# Patient Record
Sex: Female | Born: 1965 | Race: Black or African American | Hispanic: No | Smoking: Current every day smoker
Health system: Southern US, Community
[De-identification: ages and names within clinical notes are randomized; demographics above are authoritative.]

## PROBLEM LIST (undated history)

## (undated) DIAGNOSIS — T7840XA Allergy, unspecified, initial encounter: Secondary | ICD-10-CM

## (undated) DIAGNOSIS — M1990 Unspecified osteoarthritis, unspecified site: Secondary | ICD-10-CM

## (undated) DIAGNOSIS — F1090 Alcohol use, unspecified, uncomplicated: Secondary | ICD-10-CM

## (undated) DIAGNOSIS — Z7289 Other problems related to lifestyle: Secondary | ICD-10-CM

## (undated) DIAGNOSIS — Z5189 Encounter for other specified aftercare: Secondary | ICD-10-CM

## (undated) DIAGNOSIS — I10 Essential (primary) hypertension: Secondary | ICD-10-CM

## (undated) DIAGNOSIS — Z789 Other specified health status: Secondary | ICD-10-CM

## (undated) DIAGNOSIS — I447 Left bundle-branch block, unspecified: Secondary | ICD-10-CM

## (undated) HISTORY — DX: Left bundle-branch block, unspecified: I44.7

## (undated) HISTORY — DX: Other problems related to lifestyle: Z72.89

## (undated) HISTORY — PX: KNEE SURGERY: SHX244

## (undated) HISTORY — DX: Alcohol use, unspecified, uncomplicated: F10.90

## (undated) HISTORY — DX: Encounter for other specified aftercare: Z51.89

## (undated) HISTORY — DX: Other specified health status: Z78.9

## (undated) HISTORY — DX: Allergy, unspecified, initial encounter: T78.40XA

## (undated) HISTORY — PX: KNEE CARTILAGE SURGERY: SHX688

## (undated) HISTORY — DX: Unspecified osteoarthritis, unspecified site: M19.90

---

## 2000-03-26 ENCOUNTER — Encounter: Payer: Self-pay | Admitting: Emergency Medicine

## 2000-03-26 ENCOUNTER — Emergency Department (HOSPITAL_COMMUNITY): Admission: EM | Admit: 2000-03-26 | Discharge: 2000-03-26 | Payer: Self-pay | Admitting: Emergency Medicine

## 2001-11-16 ENCOUNTER — Emergency Department (HOSPITAL_COMMUNITY): Admission: EM | Admit: 2001-11-16 | Discharge: 2001-11-17 | Payer: Self-pay | Admitting: Emergency Medicine

## 2008-11-21 ENCOUNTER — Observation Stay (HOSPITAL_COMMUNITY): Admission: EM | Admit: 2008-11-21 | Discharge: 2008-11-23 | Payer: Self-pay | Admitting: Emergency Medicine

## 2010-11-26 LAB — BASIC METABOLIC PANEL
BUN: 4 mg/dL — ABNORMAL LOW (ref 6–23)
CO2: 25 mEq/L (ref 19–32)
Calcium: 8.8 mg/dL (ref 8.4–10.5)
Chloride: 105 mEq/L (ref 96–112)
Creatinine, Ser: 0.44 mg/dL (ref 0.4–1.2)
GFR calc Af Amer: >60 mL/min (ref >60)
GFR calc non Af Amer: >60 mL/min (ref >60)
Glucose, Bld: 85 mg/dL (ref 70–99)
Potassium: 3.7 mEq/L (ref 3.5–5.1)
Sodium: 138 mEq/L (ref 135–145)

## 2010-11-26 LAB — DIFFERENTIAL
Basophils Relative: 0 % (ref 0–1)
Eosinophils Absolute: 0.1 10*3/uL (ref 0.0–0.7)
Eosinophils Relative: 1 % (ref 0–5)
Lymphs Abs: 3 10*3/uL (ref 0.7–4.0)
Monocytes Absolute: 0.5 10*3/uL (ref 0.1–1.0)
Neutro Abs: 4.2 10*3/uL (ref 1.7–7.7)

## 2010-11-26 LAB — FOLATE: Folate: 7.4 ng/mL

## 2010-11-26 LAB — COMPREHENSIVE METABOLIC PANEL
Alkaline Phosphatase: 78 U/L (ref 39–117)
BUN: 4 mg/dL — ABNORMAL LOW (ref 6–23)
Chloride: 104 mEq/L (ref 96–112)
GFR calc non Af Amer: >60 mL/min (ref >60)
Glucose, Bld: 84 mg/dL (ref 70–99)
Potassium: 3.2 mEq/L — ABNORMAL LOW (ref 3.5–5.1)
Total Bilirubin: 0.8 mg/dL (ref 0.3–1.2)

## 2010-11-26 LAB — SAMPLE TO BLOOD BANK

## 2010-11-26 LAB — VITAMIN B12: Vitamin B-12: 436 pg/mL (ref 211–911)

## 2010-11-26 LAB — CROSSMATCH

## 2010-11-26 LAB — LIPID PANEL
LDL Cholesterol: 101 mg/dL — ABNORMAL HIGH (ref 0–99)
Total CHOL/HDL Ratio: 2.3 RATIO
VLDL: 8 mg/dL (ref 0–40)

## 2010-11-26 LAB — CBC
HCT: 25.9 % — ABNORMAL LOW (ref 36.0–46.0)
HCT: 26.7 % — ABNORMAL LOW (ref 36.0–46.0)
HCT: 28.9 % — ABNORMAL LOW (ref 36.0–46.0)
Hemoglobin: 8.4 g/dL — ABNORMAL LOW (ref 12.0–15.0)
Hemoglobin: 8.8 g/dL — ABNORMAL LOW (ref 12.0–15.0)
MCHC: 32 g/dL (ref 30.0–36.0)
MCHC: 32.5 g/dL (ref 30.0–36.0)
MCV: 69.4 fL — ABNORMAL LOW (ref 78.0–100.0)
MCV: 73.3 fL — ABNORMAL LOW (ref 78.0–100.0)
MCV: 73.7 fL — ABNORMAL LOW (ref 78.0–100.0)
Platelets: 286 10*3/uL (ref 150–400)
Platelets: 316 10*3/uL (ref 150–400)
RBC: 3.73 MIL/uL — ABNORMAL LOW (ref 3.87–5.11)
RDW: 19.8 % — ABNORMAL HIGH (ref 11.5–15.5)
RDW: 22.4 % — ABNORMAL HIGH (ref 11.5–15.5)
WBC: 6.3 10*3/uL (ref 4.0–10.5)
WBC: 7.8 10*3/uL (ref 4.0–10.5)

## 2010-11-26 LAB — IRON AND TIBC
Iron: 12 ug/dL — ABNORMAL LOW (ref 42–135)
Saturation Ratios: 3 % — ABNORMAL LOW (ref 20–55)
TIBC: 452 ug/dL (ref 250–470)

## 2010-11-26 LAB — HEMOCCULT GUIAC POC 1CARD (OFFICE): Fecal Occult Bld: POSITIVE

## 2010-11-26 LAB — RETICULOCYTES
RBC.: 3.94 MIL/uL (ref 3.87–5.11)
Retic Ct Pct: 0.8 % (ref 0.4–3.1)

## 2010-11-26 LAB — ABO/RH: ABO/RH(D): O POS

## 2010-11-26 LAB — HEMOGLOBIN A1C
Hgb A1c MFr Bld: 5.5 % (ref 4.6–6.1)
Mean Plasma Glucose: 111 mg/dL

## 2010-11-26 LAB — APTT: aPTT: 27 seconds (ref 24–37)

## 2010-12-30 NOTE — Discharge Summary (Signed)
Kerry [name], [place] NO.:  [id number]   MEDICAL RECORD NO.:  [id number]          PATIENT TYPE:  INP   LOCATION:  3706                         FACILITY:  [facility]   PHYSICIAN:  Altha Harm, MDDATE OF BIRTH:  Nov 28, 1965   DATE OF ADMISSION:  11/21/2008  DATE OF DISCHARGE:  11/22/2008                               DISCHARGE SUMMARY   DISCHARGE DISPOSITION:  Home.   FINAL DISCHARGE DIAGNOSES:  1. Bright red blood per rectum.  2. Acute blood loss versus chronic anemia.  3. Hypokalemia.  4. Hypomagnesemia.  5. Tobacco use disorder.  6. Binge drinker.  7. Uterine fibroids.   DISCHARGE MEDICATIONS:  1. Proctosol ER 1 tablet per rectum b.i.d.  2. Docusate sodium 100 mg p.o. b.i.d.  3. Magnesium oxide 400 mg p.o. b.i.d.  4. Iron sulfate 325 mg p.o. b.i.d.   CONSULTATIONS:  Phone consultation with Dr. Randa Evens, [facility].   PROCEDURE:  None.   DIAGNOSTIC STUDIES:  CT abdomen and pelvis with contrast which shows a  negative CT of the abdomen and no acute findings in the pelvis.  There  is an approximately 1 cm anterior uterine fibroid.   ALLERGIES:  No known drug allergies.   CODE STATUS:  Full code.   PRIMARY CARE PHYSICIAN:  Unassigned.   CHIEF COMPLAINT:  Bright red blood per rectum.   HISTORY OF PRESENT ILLNESS:  Please refer to history and physical  dictated by Dr. Gwenlyn Perking for details of the HPI.   HOSPITAL COURSE:  1. The patient was admitted on an observation basis to be evaluated      for bright red blood per rectum.  The patient had no further      bleeding during her observation period in the hospital.  The      patient did have visible external hemorrhoids and likely has some      internal hemorrhoids.  The patient's hemoglobin remained stable      during the time that she was here.  The patient has had no recent      labs to compare hemoglobins.  She did offer that she had labs at      the health department; however, I spoke with the  health department      who indicated that there was not a CBC done at that time.  The      patient is discharged home on rectal steroid, Proctosol; also on a      stool softener; and she is to follow up with Dr. Randa Evens, [facility]      - number has been provided.  The patient will also be given a      prescription for iron sulfate which she is to take on a daily      basis.  2. Hypomagnesemia, likely secondary to the patient's binge drinking,      and the patient will be given a prescription for mag oxide for      supplementation.  3. Uterine fibroid.  The patient is to follow up with her OB/GYN for      further evaluation  and treatment.  4. Tobacco use disorder.  The patient is counseled on tobacco      cessation.  5. Alcohol binge drinking.  The patient has been counseled against      further binge drinking.   The patient is to follow a high-fiber diet.  She has been given  instructions of when she would initiate use of intrarectal hemorrhoids.  She should follow up with a gastroenterologist.  In terms of her  physical restrictions - none.  Follow-up with Dr. Randa Evens in [facility]  for further evaluation as an outpatient.   Total time of discharge process - 39 minutes.      Altha Harm, MD  Electronically Signed     MAM/MEDQ  D:  11/22/2008  T:  11/22/2008  Job:  [id number]   cc:   Fayrene Fearing L. Malon Kindle., M.D.

## 2010-12-30 NOTE — H&P (Signed)
NAME:  KHADIJATOU, BORAK NO.:  [id number]   MEDICAL RECORD NO.:  [id number]          PATIENT TYPE:  EMS   LOCATION:  MAJO                         FACILITY:  [facility]   PHYSICIAN:  Eduard Clos, MDDATE OF BIRTH:  November 16, 1965   DATE OF ADMISSION:  11/21/2008  DATE OF DISCHARGE:                              HISTORY & PHYSICAL   This is a dictation on behalf of Dr. Koren Shiver.   The patient will be followed per [facility] Team G.   Kerry [name] is a 45 year old female without past medical history of  importance, except for tobacco abuse for the last 20-25 years, who came  to the emergency department complaining of rectal bleeding.  The patient  was in her normal state of health when she experienced the morning of  admission a necessity to have a bowel movement and noticed a lot of  blood together with her stool.  The patient reports a second episode of  just blood in the afternoon prior to coming to the emergency department.  She denies abdominal pain, nausea, vomiting, fever or any other  discomfort at this time.  She had never received a colonoscopy.  This is  the first time that she experienced these symptoms.  She does not have  any family history of colon cancer or any other GI problems.  Of note,  the patient is still menstruating.  Her menstrual period is normally 4-5  days, regular with two-to-three heavy days and her last menstrual period  was on November 18, 2008.   PAST MEDICAL HISTORY:  Just tobacco abuse.   ALLERGIES:  NO KNOWN DRUG ALLERGIES.   FAMILY HISTORY:  Strong family history for diabetes, hypertension and  hyperlipidemia affecting one brother, sister, mother and an uncle.   MEDICATIONS:  The patient is not taking any medication at this point.   SOCIAL HISTORY:  Positive for half to one pack of cigarettes per day for  the last 20-25 years.  Occasional drinking, normally over the weekend,  four beers.  Denies illicit drugs.   REVIEW  OF SYSTEMS:  All systems negative except as noted in the HPI.   PHYSICAL EXAMINATION:  GENERAL:  Patient in no acute distress, laying on  bed, pale.  Lungs:  Clear to auscultation bilaterally.  No crackles, no wheezing.  HEART:  Normal rate and rhythm.  Normal S1-S2.  There were no rubs or  gallops auscultated.  ABDOMEN:  Soft, not tender or distended.  Positive  bowel sounds.  No masses.  EXTREMITIES:  No edema, cyanosis or clubbing.  Good pulses bilaterally.  NEUROLOGIC:  The patient was alert, awake and oriented x3 with cranial  nerves II-XII intact.  Muscle strength 5/5.  Negative Babinski.  Normal  finger-to-nose.  RECTAL:  Normal anal tone.  No stool in rectum.  No frank bleeding.  During rectal exam, there was a positive internal hemorrhoid palpated.  SKIN:  A little bit of paleness, but no rash or lesions visible during  exam.  VITAL SIGNS:  Blood pressure 143/77, heart rate 80, respiratory rate 18.  Oxygen saturation was 100% on  room air.   LABORATORY DATA ON ADMISSION:  White blood cells 7.8, hemoglobin 8.4,  platelets 316.  MCV was 69.4 with an RDW of 19.8.  PT 13.7, INR 1.0, PTT  27.  Sodium 138, potassium 3.7, chloride 105, bicarb 25, BUN 4,  creatinine 0.44, blood sugar 85.  The patient had an anemia panel with  partial results at this time with an iron level of 12, TIBC of 152 and  an oxygen saturation of 3.  Fecal occult blood test was positive.  The  patient also had a lipid profile with an LDL of 101, HDL 83, total  cholesterol 192 and triglycerides 41.   ASSESSMENT AND PLAN:  1. Rectal bleeding.  Differential diagnoses includes diverticulosis,      internal hemorrhoids, malignancy and arteriovenous malformations.      We are going to order a CT of the abdomen and pelvis in order to      rule out diverticulosis.  We are going to give IV fluid      resuscitation.  We are going to type and screen 2 units and hold      it.  We are going to type, cross and transfuse 1  unit.  We are      going to monitor hemoglobin and hematocrit and we are going to      check an anemia panel on her.  Patient without any family history      of colon cancer or any other gastrointestinal problems.  We are      going to consult GI in the morning for a colonoscopy if needed.  2. Anemia.  Most slightly, her anemia will be secondary to her      menstrual period which is still active at this point exacerbated      due to her gastrointestinal bleed.  At this point, hemoglobin is 8.      We are going to check hemoglobin and hematocrit every 8 hours for      24 hours.  We are going to check an anemia panel.  We are going to      rule out gastrointestinal source of bleeding.  We are going to      transfuse 1 unit.  If the anemia panel is positive for iron      deficiency anemia, which is most likely what I assume, we are going      to start the patient on iron repletion by mouth.  3. Tobacco abuse.  We are going to ask social worker for cessation      counseling and will offer nicotine patch in case that she needs it.  4. Screening for her age since the patient lacks a primary care      physician.  We are going to check a TSH , lipid profile and also      hemoglobin A1c, especially because the patient has a strong family      history for diabetes, hypertension and hyperlipidemia.  5. For prophylaxis, the patient will receive Protonix and sequential      compression devices.      Rosanna Randy, MD  Electronically Signed      Eduard Clos, MD  Electronically Signed    CEM/MEDQ  D:  11/21/2008  T:  11/21/2008  Job:  [id number]

## 2013-04-10 ENCOUNTER — Emergency Department (HOSPITAL_COMMUNITY): Payer: Self-pay

## 2013-04-10 ENCOUNTER — Emergency Department (HOSPITAL_COMMUNITY)
Admission: EM | Admit: 2013-04-10 | Discharge: 2013-04-10 | Disposition: A | Discharge status: Home / Self Care | Payer: Self-pay | Attending: Emergency Medicine | Admitting: Emergency Medicine

## 2013-04-10 DIAGNOSIS — W010XXA Fall on same level from slipping, tripping and stumbling without subsequent striking against object, initial encounter: Secondary | ICD-10-CM | POA: Insufficient documentation

## 2013-04-10 DIAGNOSIS — Y929 Unspecified place or not applicable: Secondary | ICD-10-CM | POA: Insufficient documentation

## 2013-04-10 DIAGNOSIS — Y9301 Activity, walking, marching and hiking: Secondary | ICD-10-CM | POA: Insufficient documentation

## 2013-04-10 DIAGNOSIS — M25519 Pain in unspecified shoulder: Secondary | ICD-10-CM | POA: Insufficient documentation

## 2013-04-10 DIAGNOSIS — M25512 Pain in left shoulder: Secondary | ICD-10-CM

## 2013-04-10 DIAGNOSIS — W19XXXA Unspecified fall, initial encounter: Secondary | ICD-10-CM

## 2013-04-10 MED ORDER — NAPROXEN 500 MG PO TABS: 500.0000 mg | ORAL_TABLET | Freq: Two times a day (BID) | ORAL | Status: DC

## 2013-04-10 NOTE — ED Notes (Signed)
Fell on sat and hurt  Left shoulder has good radial pulse and can wiggle fingers less than 3 sec cap refill

## 2013-04-10 NOTE — ED Provider Notes (Signed)
  CSN: [id number]     Arrival date & time 04/10/13  4098 History     First MD Initiated Contact with Patient 04/10/13 1009     Chief Complaint  Patient presents with  . Fall  . Arm Pain   (Consider location/radiation/quality/duration/timing/severity/associated sxs/prior Treatment) HPI Comments: Patient presents with a chief complaint of left shoulder pain.  Pain has been present since she fell two days ago.  She states that she tripped on a hole while wearing flip flops and landed on her left shoulder.  She has had constant pain of the left shoulder since that time.  Pain worse with movement and palpation.  She denies hitting her head.  Denies neck or back pain.  Denies any other pain.  Denies numbness or tingling.  She has not taken anything for pain prior to arrival.  No prior injury to the shoulder.  The history is provided by the patient.    No past medical history on file. No past surgical history on file. No family history on file. History  Substance Use Topics  . Smoking status: Not on file  . Smokeless tobacco: Not on file  . Alcohol Use: Not on file   OB History   No data available     Review of Systems  Musculoskeletal:       Left shoulder pain  All other systems reviewed and are negative.    Allergies  Review of patient's allergies indicates no known allergies.  Home Medications  No current outpatient prescriptions on file. BP 145/84  Pulse 112  Temp(Src) 98.3 F (36.8 C)  Resp 16  SpO2 99% Physical Exam  Nursing note and vitals reviewed. Constitutional: She appears well-developed and well-nourished.  HENT:  Head: Normocephalic and atraumatic.  Neck: Normal range of motion. Neck supple.  Cardiovascular: Normal rate, regular rhythm and normal heart sounds.   Pulses:      Radial pulses are 2+ on the right side, and 2+ on the left side.  Pulmonary/Chest: Effort normal and breath sounds normal.  Musculoskeletal:       Left shoulder: She exhibits bony  tenderness. She exhibits no swelling, no effusion, no deformity and normal pulse.       Left elbow: She exhibits normal range of motion, no swelling and no deformity. No tenderness found.  Full ROM of the left shoulder, but increased pain with ROM  Neurological: She is alert.  Normal sensation of left fingers  Skin: Skin is warm and dry.     No bruising or abrasions of the left shoulder.  Psychiatric: She has a normal mood and affect.    ED Course   Procedures (including critical care time)  Labs Reviewed - No data to display Dg Shoulder Left  04/10/2013   *RADIOLOGY REPORT*  Clinical Data: Left shoulder pain following injury  LEFT SHOULDER - 2+ VIEW  Comparison: None.  Findings: No acute fracture or dislocation is noted.  No soft tissue changes are seen.  IMPRESSION: No acute abnormality noted.   Original Report Authenticated By: Alcide Clever, M.D.   No diagnosis found.  MDM  Patient presenting with left shoulder pain after a mechanical fall that occurred two days ago.  Xray negative.  Patient neurovascularly intact.  Patient offered sling, but declined.  Patient discharged home with Rx for Naproxen.  Return precautions given.  Pascal Lux [place], PA-C 04/10/13 1125

## 2013-04-10 NOTE — ED Notes (Signed)
Patient transported to X-ray 

## 2013-04-12 NOTE — ED Provider Notes (Signed)
Medical screening examination/treatment/procedure(s) were performed by non-physician practitioner and as supervising physician I was immediately available for consultation/collaboration.   Kacie Huxtable M Dink Creps, DO 04/12/13 2105 

## 2013-08-12 ENCOUNTER — Emergency Department (HOSPITAL_COMMUNITY)
Admission: EM | Admit: 2013-08-12 | Discharge: 2013-08-12 | Disposition: A | Discharge status: Home / Self Care | Payer: Self-pay | Attending: Emergency Medicine | Admitting: Emergency Medicine

## 2013-08-12 ENCOUNTER — Encounter (HOSPITAL_COMMUNITY): Payer: Self-pay | Admitting: Emergency Medicine

## 2013-08-12 DIAGNOSIS — R21 Rash and other nonspecific skin eruption: Secondary | ICD-10-CM | POA: Insufficient documentation

## 2013-08-12 DIAGNOSIS — F17200 Nicotine dependence, unspecified, uncomplicated: Secondary | ICD-10-CM | POA: Insufficient documentation

## 2013-08-12 DIAGNOSIS — Z791 Long term (current) use of non-steroidal anti-inflammatories (NSAID): Secondary | ICD-10-CM | POA: Insufficient documentation

## 2013-08-12 MED ORDER — PREDNISONE 20 MG PO TABS: ORAL_TABLET | ORAL | Status: DC

## 2013-08-12 NOTE — ED Provider Notes (Signed)
CSN: [id number]     Arrival date & time 08/12/13  1222 History   None   This chart was scribed for Trixie Dredge PA-C, a non-physician practitioner working with Ethelda Chick, MD by Lewanda Rife, ED Scribe. This patient was seen in room TR05C/TR05C and the patient's care was started at 1:12 PM     Chief Complaint  Patient presents with  . Rash   (Consider location/radiation/quality/duration/timing/severity/associated sxs/prior Treatment) The history is provided by the patient and medical records. No language interpreter was used.   HPI Comments: Kerry [name] is a 47 y.o. female who presents to the Emergency Department complaining of constant unchanged pruritic rash on torso onset 2 weeks. Describes pain as pins and needles. Denies associated new detergents, contacts with similar rash, new furniture, new lotions.  Denies seeing any insects.  Denies abdominal pain, recent illness.   History reviewed. No pertinent past medical history. History reviewed. No pertinent past surgical history. No family history on file. History  Substance Use Topics  . Smoking status: Current Every Day Smoker  . Smokeless tobacco: Not on file  . Alcohol Use: Yes   OB History   Grav Para Term Preterm Abortions TAB SAB Ect Mult Living                 Review of Systems  Constitutional: Negative for fever and chills.  Skin: Positive for rash. Negative for color change and wound.  Allergic/Immunologic: Negative for immunocompromised state.    Allergies  Review of patient's allergies indicates no known allergies.  Home Medications   Current Outpatient Rx  Name  Route  Sig  Dispense  Refill  . naproxen (NAPROSYN) 500 MG tablet   Oral   Take 1 tablet (500 mg total) by mouth 2 (two) times daily.   30 tablet   0    BP 154/109  Pulse 100  Temp(Src) 98 F (36.7 C) (Oral)  Resp 16  Wt 125 lb 3.2 oz (56.79 kg)  SpO2 100%  LMP 07/17/2013 Physical Exam  Nursing note and vitals  reviewed. Constitutional: She appears well-developed and well-nourished. No distress.  HENT:  Head: Normocephalic and atraumatic.  Neck: Neck supple.  Pulmonary/Chest: Effort normal.  Abdominal: Soft. There is no tenderness.  Neurological: She is alert.  Skin: She is not diaphoretic.  Non-tender clusters of papular rash on abdomen and low back only.  Not on extremities, face, chest, or upper back.  No burrows noted.  No overlying erythema, edema, warmth, or tenderness.  No discharge.     ED Course  Procedures   COORDINATION OF CARE:  Nursing notes reviewed. Vital signs reviewed. Initial pt interview and examination performed.      Treatment plan initiated:Medications - No data to display   Initial diagnostic testing ordered.    Labs Review Labs Reviewed - No data to display Imaging Review No results found.  EKG Interpretation   None       MDM   1. Rash    Pt with pruritic rash around waistline that does not appear to be scabies, likely contact dermatitis from unknown source.  D/C home with prednisone.  Derm follow up if lack of improvement.  Discussed  findings, treatment, and follow up  with patient.  Pt given return precautions.  Pt verbalizes understanding and agrees with plan.      I personally performed the services described in this documentation, which was scribed in my presence. The recorded information has been reviewed and is  accurate.    Trixie Dredge, PA-C 08/12/13 1541

## 2013-08-12 NOTE — ED Provider Notes (Signed)
Medical screening examination/treatment/procedure(s) were performed by non-physician practitioner and as supervising physician I was immediately available for consultation/collaboration.  EKG Interpretation   None        Tamaiya Bump K Linker, MD 08/12/13 1553 

## 2013-08-12 NOTE — ED Notes (Signed)
Pt. Stated, I ve had a rash for 2 weeks.

## 2015-04-14 ENCOUNTER — Encounter (HOSPITAL_COMMUNITY): Payer: Self-pay | Admitting: Emergency Medicine

## 2015-04-14 ENCOUNTER — Emergency Department (HOSPITAL_COMMUNITY): Payer: Self-pay

## 2015-04-14 ENCOUNTER — Emergency Department (HOSPITAL_COMMUNITY)
Admission: EM | Admit: 2015-04-14 | Discharge: 2015-04-15 | Disposition: A | Discharge status: Home / Self Care | Payer: Self-pay | Attending: Emergency Medicine | Admitting: Emergency Medicine

## 2015-04-14 DIAGNOSIS — Z880 Allergy status to penicillin: Secondary | ICD-10-CM | POA: Insufficient documentation

## 2015-04-14 DIAGNOSIS — F1010 Alcohol abuse, uncomplicated: Secondary | ICD-10-CM | POA: Insufficient documentation

## 2015-04-14 DIAGNOSIS — J069 Acute upper respiratory infection, unspecified: Secondary | ICD-10-CM | POA: Insufficient documentation

## 2015-04-14 DIAGNOSIS — Z7951 Long term (current) use of inhaled steroids: Secondary | ICD-10-CM | POA: Insufficient documentation

## 2015-04-14 DIAGNOSIS — Z720 Tobacco use: Secondary | ICD-10-CM | POA: Insufficient documentation

## 2015-04-14 LAB — CBC
HEMATOCRIT: 27.2 % — AB (ref 36.0–46.0)
HEMOGLOBIN: 8 g/dL — AB (ref 12.0–15.0)
MCH: 17.7 pg — ABNORMAL LOW (ref 26.0–34.0)
MCHC: 29.4 g/dL — ABNORMAL LOW (ref 30.0–36.0)
MCV: 60 fL — ABNORMAL LOW (ref 78.0–100.0)
Platelets: 513 10*3/uL — ABNORMAL HIGH (ref 150–400)
RBC: 4.53 MIL/uL (ref 3.87–5.11)
RDW: 19.9 % — ABNORMAL HIGH (ref 11.5–15.5)
WBC: 9.5 10*3/uL (ref 4.0–10.5)

## 2015-04-14 LAB — BASIC METABOLIC PANEL
ANION GAP: 13 (ref 5–15)
BUN: <5 mg/dL — ABNORMAL LOW (ref 6–20)
CO2: 22 mmol/L (ref 22–32)
Calcium: 9.1 mg/dL (ref 8.9–10.3)
Chloride: 104 mmol/L (ref 101–111)
Creatinine, Ser: 0.53 mg/dL (ref 0.44–1.00)
GFR calc Af Amer: >60 mL/min (ref >60)
Glucose, Bld: 87 mg/dL (ref 65–99)
POTASSIUM: 3.4 mmol/L — AB (ref 3.5–5.1)
SODIUM: 139 mmol/L (ref 135–145)

## 2015-04-14 LAB — ETHANOL: Alcohol, Ethyl (B): 275 mg/dL — ABNORMAL HIGH (ref <5)

## 2015-04-14 LAB — I-STAT TROPONIN, ED: TROPONIN I, POC: 0 ng/mL (ref 0.00–0.08)

## 2015-04-14 NOTE — ED Notes (Signed)
Patient reports pain with productive, "mucousy" cough. Denies chest pain. Reports symptoms began last Tuesday. No acute distress noted.

## 2015-04-14 NOTE — ED Notes (Signed)
Pt. reports right chest pain with SOB , productive cough and chest congestion onset last week , denies nausea or diaphoresis , pain increases with deep inspiration and when coughing . Denies fever or chills .Drank ETOH this evening .

## 2015-04-14 NOTE — ED Provider Notes (Signed)
CSN: [id number]     Arrival date & time 04/14/15  2100 History  This chart was scribed for Shon Baton, MD by Evon Slack, ED Scribe. This patient was seen in room A04C/A04C and the patient's care was started at 11:05 PM.     No chief complaint on file.  The history is provided by the patient. No language interpreter was used.   HPI Comments: Kerry [name] is a 49 y.o. female who presents to the Emergency Department complaining of right sided sore throat onset 5 days prior. Pt states that she feels as if she swallowed a pill that is stuck in her throat. Pt states that the pain in her throat is worse with swallowing. Pt states that the pain radiates into her right chest. She states that burping or coughing makes the pain worse. Pt reports associated congestion, productive cough and voice change. 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 Denies fever or other related symptoms. Pt denies Hx of CAD, HTN or DM.   History reviewed. No pertinent past medical history. Past Surgical History  Procedure Laterality Date  . Knee surgery     No family history on file. Social History  Substance Use Topics  . Smoking status: Current Every Day Smoker  . Smokeless tobacco: None  . Alcohol Use: Yes   OB History    No data available     Review of Systems  Constitutional: Negative for fever.  HENT: Positive for sore throat and voice change.   Respiratory: Positive for cough.   Cardiovascular: Positive for chest pain. Negative for leg swelling.  Gastrointestinal: Negative for nausea, vomiting, abdominal pain and diarrhea.  Skin: Negative for rash.  All other systems reviewed and are negative.     Allergies  Diphenhydramine and Amoxicillin  Home Medications   Prior to Admission medications   Medication Sig Start Date End Date Taking? Authorizing Provider  fluticasone (FLONASE) 50 MCG/ACT nasal spray Place 2 sprays into both nostrils daily. 04/15/15   Shon Baton, MD   naproxen (NAPROSYN) 500 MG tablet Take 1 tablet (500 mg total) by mouth 2 (two) times daily with a meal. 04/15/15   Shon Baton, MD  predniSONE (DELTASONE) 20 MG tablet 3 tabs po daily x 3 days, then 2 tabs x 3 days, then 1.5 tabs x 3 days, then 1 tab x 3 days, then 0.5 tabs x 3 days Patient not taking: Reported on 04/14/2015 08/12/13   Trixie Dredge, PA-C  sodium chloride (OCEAN) 0.65 % SOLN nasal spray Place 1 spray into both nostrils as needed for congestion. 04/15/15   Shon Baton, MD   BP 115/72 mmHg  Pulse 76  Temp(Src) 98.2 F (36.8 C) (Oral)  Resp 14  Ht 4\' 11"  (1.499 m)  Wt 135 lb (61.236 kg)  BMI 27.25 kg/m2  SpO2 98%  LMP 07/17/2013   Physical Exam  Constitutional: She is oriented to person, place, and time.  Intoxicated  HENT:  Head: Normocephalic and atraumatic.  Right Ear: External ear normal.  Left Ear: External ear normal.  Mouth/Throat: Oropharynx is clear and moist.  No tonsillar enlargement, no evidence of tonsillar exudate, uvula midline  Eyes: Pupils are equal, round, and reactive to light.  Neck: Neck supple.  Cardiovascular: Normal rate, regular rhythm and normal heart sounds.   No murmur heard. Pulmonary/Chest: Effort normal and breath sounds normal. No respiratory distress. She has no wheezes. She exhibits no tenderness.  Abdominal: Soft. Bowel sounds  are normal. There is no tenderness. There is no rebound.  Musculoskeletal: She exhibits no edema.  Lymphadenopathy:    She has cervical adenopathy.  Neurological: She is alert and oriented to person, place, and time.  Skin: Skin is warm and dry.  Psychiatric: She has a normal mood and affect.  Nursing note and vitals reviewed.   ED Course  Procedures (including critical care time) DIAGNOSTIC STUDIES: Oxygen Saturation is 99% on RA, normal by my interpretation.    COORDINATION OF CARE: 11:29 PM-Discussed treatment plan with pt at bedside and pt agreed to plan.     Labs Review Labs  Reviewed  BASIC METABOLIC PANEL - Abnormal; Notable for the following:    Potassium 3.4 (*)    BUN <5 (*)    All other components within normal limits  CBC - Abnormal; Notable for the following:    Hemoglobin 8.0 (*)    HCT 27.2 (*)    MCV 60.0 (*)    MCH 17.7 (*)    MCHC 29.4 (*)    RDW 19.9 (*)    Platelets 513 (*)    All other components within normal limits  ETHANOL - Abnormal; Notable for the following:    Alcohol, Ethyl (B) 275 (*)    All other components within normal limits  RAPID STREP SCREEN (NOT AT [facility])  CULTURE, GROUP A STREP  I-STAT TROPOININ, ED    Imaging Review Dg Chest 2 View  04/14/2015   CLINICAL DATA:  Nonproductive cough with choking sensation when swallowing and right-sided chest swelling.  EXAM: CHEST  2 VIEW  COMPARISON:  None.  FINDINGS: The heart size and mediastinal contours are within normal limits. Both lungs are clear. The visualized skeletal structures are unremarkable.  IMPRESSION: No active cardiopulmonary disease.   Electronically Signed   By: Elberta Fortis M.D.   On: 04/14/2015 22:05   I have personally reviewed and evaluated these images and lab results as part of my medical decision-making.   EKG Interpretation   Date/Time:  Sunday April 14 2015 21:07:40 EDT Ventricular Rate:  95 PR Interval:  154 QRS Duration: 78 QT Interval:  380 QTC Calculation: 477 R Axis:   86 Text Interpretation:  Normal sinus rhythm Normal ECG No prior for  comparison Confirmed by HORTON  MD, Toni Amend ([id number]) on 04/14/2015 11:04:02  PM      MDM   Final diagnoses:  Upper respiratory infection  Alcohol abuse    Patient presents with sore throat, cough, congestion, and chest pain. Nontoxic on exam. Vital signs are reassuring. Patient has been afebrile. Also endorses alcohol use.  EKG and chest x-ray reassuring. No evidence of pneumonia. Doubt ACS given clinical history of upper respiratory symptoms.  Strep screen is negative. Discussed with patient  supportive care home including nasal saline, nasal steroid-induced, and NSAIDs area patient stated understanding.  After history, exam, and medical workup I feel the patient has been appropriately medically screened and is safe for discharge home. Pertinent diagnoses were discussed with the patient. Patient was given return precautions.  I personally performed the services described in this documentation, which was scribed in my presence. The recorded information has been reviewed and is accurate.      Shon Baton, MD 04/15/15 [id number]

## 2015-04-15 LAB — RAPID STREP SCREEN (MED CTR MEBANE ONLY): STREPTOCOCCUS, GROUP A SCREEN (DIRECT): NEGATIVE

## 2015-04-15 MED ORDER — NAPROXEN 500 MG PO TABS: 500.0000 mg | ORAL_TABLET | Freq: Two times a day (BID) | ORAL | Status: DC

## 2015-04-15 MED ORDER — SALINE SPRAY 0.65 % NA SOLN: 1.0000 | NASAL | Status: DC | PRN

## 2015-04-15 MED ORDER — FLUTICASONE PROPIONATE 50 MCG/ACT NA SUSP
2.0000 | Freq: Every day | NASAL | Status: DC
Start: 2015-04-15 — End: 2017-09-22

## 2015-04-15 NOTE — Discharge Instructions (Signed)
You were seen today for cough, congestion, sore throat, and chest pain. Your symptoms are most suggestive of a viral upper respiratory infection. Your heart testing and lab work is reassuring. Antibiotics do not help with viral illnesses. Supportive care with nasal saline, nasal steroidal, and anti-inflammatory use. See return precautions below.  Cough, Adult  A cough is a reflex that helps clear your throat and airways. It can help heal the body or may be a reaction to an irritated airway. A cough may only last 2 or 3 weeks (acute) or may last more than 8 weeks (chronic).  CAUSES Acute cough:  Viral or bacterial infections. Chronic cough:  Infections.  Allergies.  Asthma.  Post-nasal drip.  Smoking.  Heartburn or acid reflux.  Some medicines.  Chronic lung problems (COPD).  Cancer. SYMPTOMS   Cough.  Fever.  Chest pain.  Increased breathing rate.  High-pitched whistling sound when breathing (wheezing).  Colored mucus that you cough up (sputum). TREATMENT   A bacterial cough may be treated with antibiotic medicine.  A viral cough must run its course and will not respond to antibiotics.  Your caregiver may recommend other treatments if you have a chronic cough. HOME CARE INSTRUCTIONS   Only take over-the-counter or prescription medicines for pain, discomfort, or fever as directed by your caregiver. Use cough suppressants only as directed by your caregiver.  Use a cold steam vaporizer or humidifier in your bedroom or home to help loosen secretions.  Sleep in a semi-upright position if your cough is worse at night.  Rest as needed.  Stop smoking if you smoke. SEEK IMMEDIATE MEDICAL CARE IF:   You have pus in your sputum.  Your cough starts to worsen.  You cannot control your cough with suppressants and are losing sleep.  You begin coughing up blood.  You have difficulty breathing.  You develop pain which is getting worse or is uncontrolled with  medicine.  You have a fever. MAKE SURE YOU:   Understand these instructions.  Will watch your condition.  Will get help right away if you are not doing well or get worse. Document Released: 01/30/2011 Document Revised: 10/26/2011 Document Reviewed: 01/30/2011 [facility] Patient Information 2015 [place], [place]. This information is not intended to replace advice given to you by your health care provider. Make sure you discuss any questions you have with your health care provider. Upper Respiratory Infection, Adult An upper respiratory infection (URI) is also sometimes known as the common cold. The upper respiratory tract includes the nose, sinuses, throat, trachea, and bronchi. Bronchi are the airways leading to the lungs. Most people improve within 1 week, but symptoms can last up to 2 weeks. A residual cough may last even longer.  CAUSES Many different viruses can infect the tissues lining the upper respiratory tract. The tissues become irritated and inflamed and often become very moist. Mucus production is also common. A cold is contagious. You can easily spread the virus to others by oral contact. This includes kissing, sharing a glass, coughing, or sneezing. Touching your mouth or nose and then touching a surface, which is then touched by another person, can also spread the virus. SYMPTOMS  Symptoms typically develop 1 to 3 days after you come in contact with a cold virus. Symptoms vary from person to person. They may include:  Runny nose.  Sneezing.  Nasal congestion.  Sinus irritation.  Sore throat.  Loss of voice (laryngitis).  Cough.  Fatigue.  Muscle aches.  Loss of appetite.  Headache.  Low-grade fever. DIAGNOSIS  You might diagnose your own cold based on familiar symptoms, since most people get a cold 2 to 3 times a year. Your caregiver can confirm this based on your exam. Most importantly, your caregiver can check that your symptoms are not due to another disease  such as strep throat, sinusitis, pneumonia, asthma, or epiglottitis. Blood tests, throat tests, and X-rays are not necessary to diagnose a common cold, but they may sometimes be helpful in excluding other more serious diseases. Your caregiver will decide if any further tests are required. RISKS AND COMPLICATIONS  You may be at risk for a more severe case of the common cold if you smoke cigarettes, have chronic heart disease (such as heart failure) or lung disease (such as asthma), or if you have a weakened immune system. The very young and very old are also at risk for more serious infections. Bacterial sinusitis, middle ear infections, and bacterial pneumonia can complicate the common cold. The common cold can worsen asthma and chronic obstructive pulmonary disease (COPD). Sometimes, these complications can require emergency medical care and may be life-threatening. PREVENTION  The best way to protect against getting a cold is to practice good hygiene. Avoid oral or hand contact with people with cold symptoms. Wash your hands often if contact occurs. There is no clear evidence that vitamin C, vitamin E, echinacea, or exercise reduces the chance of developing a cold. However, it is always recommended to get plenty of rest and practice good nutrition. TREATMENT  Treatment is directed at relieving symptoms. There is no cure. Antibiotics are not effective, because the infection is caused by a virus, not by bacteria. Treatment may include:  Increased fluid intake. Sports drinks offer valuable electrolytes, sugars, and fluids.  Breathing heated mist or steam (vaporizer or shower).  Eating chicken soup or other clear broths, and maintaining good nutrition.  Getting plenty of rest.  Using gargles or lozenges for comfort.  Controlling fevers with ibuprofen or acetaminophen as directed by your caregiver.  Increasing usage of your inhaler if you have asthma. Zinc gel and zinc lozenges, taken in the first  24 hours of the common cold, can shorten the duration and lessen the severity of symptoms. Pain medicines may help with fever, muscle aches, and throat pain. A variety of non-prescription medicines are available to treat congestion and runny nose. Your caregiver can make recommendations and may suggest nasal or lung inhalers for other symptoms.  HOME CARE INSTRUCTIONS   Only take over-the-counter or prescription medicines for pain, discomfort, or fever as directed by your caregiver.  Use a warm mist humidifier or inhale steam from a shower to increase air moisture. This may keep secretions moist and make it easier to breathe.  Drink enough water and fluids to keep your urine clear or pale yellow.  Rest as needed.  Return to work when your temperature has returned to normal or as your caregiver advises. You may need to stay home longer to avoid infecting others. You can also use a face mask and careful hand washing to prevent spread of the virus. SEEK MEDICAL CARE IF:   After the first few days, you feel you are getting worse rather than better.  You need your caregiver's advice about medicines to control symptoms.  You develop chills, worsening shortness of breath, or brown or red sputum. These may be signs of pneumonia.  You develop yellow or brown nasal discharge or pain in the face, especially when you bend  forward. These may be signs of sinusitis.  You develop a fever, swollen neck glands, pain with swallowing, or white areas in the back of your throat. These may be signs of strep throat. SEEK IMMEDIATE MEDICAL CARE IF:   You have a fever.  You develop severe or persistent headache, ear pain, sinus pain, or chest pain.  You develop wheezing, a prolonged cough, cough up blood, or have a change in your usual mucus (if you have chronic lung disease).  You develop sore muscles or a stiff neck. Document Released: 01/27/2001 Document Revised: 10/26/2011 Document Reviewed:  11/08/2013 [facility] Patient Information 2015 [place], [place]. This information is not intended to replace advice given to you by your health care provider. Make sure you discuss any questions you have with your health care provider.

## 2015-04-15 NOTE — ED Notes (Signed)
Discharge instructions/prescriptions reviewed with patient. Understanding verbalized. Denies pain. Patient refused wheelchair at time of discharge. No distress noted.

## 2015-04-17 LAB — CULTURE, GROUP A STREP: STREP A CULTURE: NEGATIVE

## 2017-07-21 ENCOUNTER — Encounter (HOSPITAL_COMMUNITY): Payer: Self-pay | Admitting: Emergency Medicine

## 2017-07-21 ENCOUNTER — Emergency Department (HOSPITAL_COMMUNITY)
Admission: EM | Admit: 2017-07-21 | Discharge: 2017-07-21 | Disposition: A | Discharge status: Home / Self Care | Payer: No Typology Code available for payment source | Attending: Physician Assistant | Admitting: Physician Assistant

## 2017-07-21 ENCOUNTER — Emergency Department (HOSPITAL_COMMUNITY): Payer: No Typology Code available for payment source

## 2017-07-21 DIAGNOSIS — I10 Essential (primary) hypertension: Secondary | ICD-10-CM | POA: Insufficient documentation

## 2017-07-21 DIAGNOSIS — R000 Tachycardia, unspecified: Secondary | ICD-10-CM | POA: Diagnosis present

## 2017-07-21 LAB — COMPREHENSIVE METABOLIC PANEL
ALT: 20 U/L (ref 14–54)
ANION GAP: 14 (ref 5–15)
AST: 47 U/L — ABNORMAL HIGH (ref 15–41)
Albumin: 3.8 g/dL (ref 3.5–5.0)
Alkaline Phosphatase: 94 U/L (ref 38–126)
BUN: 6 mg/dL (ref 6–20)
CHLORIDE: 99 mmol/L — AB (ref 101–111)
CO2: 25 mmol/L (ref 22–32)
Calcium: 9.8 mg/dL (ref 8.9–10.3)
Creatinine, Ser: 0.97 mg/dL (ref 0.44–1.00)
Glucose, Bld: 116 mg/dL — ABNORMAL HIGH (ref 65–99)
POTASSIUM: 3.5 mmol/L (ref 3.5–5.1)
SODIUM: 138 mmol/L (ref 135–145)
Total Bilirubin: 0.4 mg/dL (ref 0.3–1.2)
Total Protein: 8.1 g/dL (ref 6.5–8.1)

## 2017-07-21 LAB — CBC WITH DIFFERENTIAL/PLATELET
Basophils Absolute: 0 10*3/uL (ref 0.0–0.1)
Basophils Relative: 1 %
EOS ABS: 0 10*3/uL (ref 0.0–0.7)
Eosinophils Relative: 0 %
HEMATOCRIT: 34.3 % — AB (ref 36.0–46.0)
HEMOGLOBIN: 10.9 g/dL — AB (ref 12.0–15.0)
Lymphocytes Relative: 25 %
Lymphs Abs: 1.8 10*3/uL (ref 0.7–4.0)
MCH: 24.5 pg — AB (ref 26.0–34.0)
MCHC: 31.8 g/dL (ref 30.0–36.0)
MCV: 77.3 fL — ABNORMAL LOW (ref 78.0–100.0)
MONOS PCT: 6 %
Monocytes Absolute: 0.4 10*3/uL (ref 0.1–1.0)
NEUTROS ABS: 4.8 10*3/uL (ref 1.7–7.7)
NEUTROS PCT: 68 %
Platelets: 388 10*3/uL (ref 150–400)
RBC: 4.44 MIL/uL (ref 3.87–5.11)
RDW: 17.5 % — ABNORMAL HIGH (ref 11.5–15.5)
WBC: 7.1 10*3/uL (ref 4.0–10.5)

## 2017-07-21 LAB — HCG, SERUM, QUALITATIVE: Preg, Serum: NEGATIVE

## 2017-07-21 LAB — I-STAT TROPONIN, ED: TROPONIN I, POC: 0 ng/mL (ref 0.00–0.08)

## 2017-07-21 MED ORDER — HYDROCHLOROTHIAZIDE 25 MG PO TABS: 25.0000 mg | ORAL_TABLET | Freq: Every day | ORAL | 1 refills | Status: DC

## 2017-07-21 MED ORDER — SODIUM CHLORIDE 0.9 % IV BOLUS (SEPSIS)
1000.0000 mL | Freq: Once | INTRAVENOUS | Status: AC
  Administered 2017-07-21: 1000 mL via INTRAVENOUS

## 2017-07-21 MED ORDER — IBUPROFEN 800 MG PO TABS
800.0000 mg | ORAL_TABLET | Freq: Once | ORAL | Status: AC
  Administered 2017-07-21: 800 mg via ORAL
  Filled 2017-07-21: qty 1

## 2017-07-21 MED ORDER — HYDROCHLOROTHIAZIDE 25 MG PO TABS
25.0000 mg | ORAL_TABLET | Freq: Once | ORAL | Status: AC
  Administered 2017-07-21: 25 mg via ORAL
  Filled 2017-07-21: qty 1
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
## 2017-07-21 NOTE — ED Notes (Signed)
Patient transported to X-ray 
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
## 2017-07-21 NOTE — ED Notes (Signed)
Back from xray

## 2017-07-21 NOTE — Discharge Instructions (Addendum)
To find a primary care or specialty doctor please call [phone] or [phone] to access "[facility] Find a Doctor Service." ° °You may also go on the [facility] website at [email or web address] ° °There are also multiple [facility] and [facility] practices throughout the [place] that are frequently accepting new patients. You may find a clinic that is close to your home and contact them. ° °[facility] -  °[address] °[place] [address] °[phone] ° °[facility] in [place] (also locations in [place] and [place]) -  °[address] °[place] [address] [phone] ° °[facility] -  °[address] °[place] [address] [phone] ° ° °

## 2017-07-22 ENCOUNTER — Encounter (HOSPITAL_COMMUNITY): Payer: Self-pay | Admitting: Emergency Medicine

## 2017-07-31 ENCOUNTER — Other Ambulatory Visit: Payer: Self-pay

## 2017-07-31 ENCOUNTER — Encounter (HOSPITAL_COMMUNITY): Payer: Self-pay

## 2017-07-31 ENCOUNTER — Emergency Department (HOSPITAL_COMMUNITY)
Admission: EM | Admit: 2017-07-31 | Discharge: 2017-07-31 | Disposition: A | Discharge status: Home / Self Care | Payer: No Typology Code available for payment source | Attending: Emergency Medicine | Admitting: Emergency Medicine

## 2017-07-31 DIAGNOSIS — Z79899 Other long term (current) drug therapy: Secondary | ICD-10-CM | POA: Diagnosis not present

## 2017-07-31 DIAGNOSIS — I10 Essential (primary) hypertension: Secondary | ICD-10-CM | POA: Diagnosis not present

## 2017-07-31 DIAGNOSIS — F17210 Nicotine dependence, cigarettes, uncomplicated: Secondary | ICD-10-CM | POA: Insufficient documentation

## 2017-07-31 DIAGNOSIS — F1010 Alcohol abuse, uncomplicated: Secondary | ICD-10-CM | POA: Diagnosis not present

## 2017-07-31 DIAGNOSIS — Z760 Encounter for issue of repeat prescription: Secondary | ICD-10-CM

## 2017-07-31 DIAGNOSIS — R1310 Dysphagia, unspecified: Secondary | ICD-10-CM | POA: Diagnosis present

## 2017-07-31 HISTORY — DX: Essential (primary) hypertension: I10

## 2017-07-31 LAB — URINALYSIS, ROUTINE W REFLEX MICROSCOPIC
Bilirubin Urine: NEGATIVE
GLUCOSE, UA: NEGATIVE mg/dL
Hgb urine dipstick: NEGATIVE
Ketones, ur: NEGATIVE mg/dL
LEUKOCYTES UA: NEGATIVE
Nitrite: NEGATIVE
PH: 6 (ref 5.0–8.0)
Protein, ur: NEGATIVE mg/dL
Specific Gravity, Urine: 1.002 — ABNORMAL LOW (ref 1.005–1.030)

## 2017-07-31 LAB — ETHANOL: Alcohol, Ethyl (B): 237 mg/dL — ABNORMAL HIGH (ref <10)

## 2017-07-31 LAB — BASIC METABOLIC PANEL
Anion gap: 11 (ref 5–15)
BUN: 13 mg/dL (ref 6–20)
CHLORIDE: 97 mmol/L — AB (ref 101–111)
CO2: 23 mmol/L (ref 22–32)
Calcium: 9.1 mg/dL (ref 8.9–10.3)
Creatinine, Ser: 0.72 mg/dL (ref 0.44–1.00)
GFR calc Af Amer: >60 mL/min (ref >60)
GFR calc non Af Amer: >60 mL/min (ref >60)
GLUCOSE: 92 mg/dL (ref 65–99)
POTASSIUM: 3.4 mmol/L — AB (ref 3.5–5.1)
Sodium: 131 mmol/L — ABNORMAL LOW (ref 135–145)

## 2017-07-31 LAB — CBC
HEMATOCRIT: 35 % — AB (ref 36.0–46.0)
HEMOGLOBIN: 11.2 g/dL — AB (ref 12.0–15.0)
MCH: 24.2 pg — AB (ref 26.0–34.0)
MCHC: 32 g/dL (ref 30.0–36.0)
MCV: 75.8 fL — AB (ref 78.0–100.0)
Platelets: 387 10*3/uL (ref 150–400)
RBC: 4.62 MIL/uL (ref 3.87–5.11)
RDW: 16.7 % — ABNORMAL HIGH (ref 11.5–15.5)
WBC: 9.4 10*3/uL (ref 4.0–10.5)

## 2017-07-31 LAB — RAPID URINE DRUG SCREEN, HOSP PERFORMED
AMPHETAMINES: NOT DETECTED
Barbiturates: NOT DETECTED
Benzodiazepines: NOT DETECTED
Cocaine: NOT DETECTED
OPIATES: NOT DETECTED
TETRAHYDROCANNABINOL: NOT DETECTED

## 2017-07-31 MED ORDER — HYDROCHLOROTHIAZIDE 25 MG PO TABS
12.5000 mg | ORAL_TABLET | Freq: Every day | ORAL | 1 refills | Status: DC
Start: 2017-07-31 — End: 2017-08-11
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
## 2017-07-31 NOTE — ED Notes (Signed)
Pt cannot stop laughing during assessment

## 2017-07-31 NOTE — Discharge Instructions (Addendum)
As discussed, take 1/2 a pill once daily with a full glass of water. Follow up with your Primary care provider at the wellness center at your upcoming appointment.  Drink plenty of fluids and avoid alcohol. Return sooner if symptoms worsen or new concerning symptoms in the meantime.

## 2017-07-31 NOTE — ED Notes (Signed)
Difficulty getting pt to describe her pain in her back and in her neck.
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
## 2017-07-31 NOTE — ED Notes (Signed)
Pt tolerated fluid challenge with no difficulties

## 2017-08-11 ENCOUNTER — Encounter: Payer: Self-pay | Admitting: Nurse Practitioner

## 2017-08-11 ENCOUNTER — Ambulatory Visit: Payer: Self-pay | Attending: Nurse Practitioner | Admitting: Nurse Practitioner

## 2017-08-11 VITALS — BP 149/77 | HR 109 | Temp 98.9°F | Ht 59.0 in | Wt 143.2 lb

## 2017-08-11 DIAGNOSIS — Z79899 Other long term (current) drug therapy: Secondary | ICD-10-CM | POA: Insufficient documentation

## 2017-08-11 DIAGNOSIS — F17210 Nicotine dependence, cigarettes, uncomplicated: Secondary | ICD-10-CM | POA: Insufficient documentation

## 2017-08-11 DIAGNOSIS — Z8249 Family history of ischemic heart disease and other diseases of the circulatory system: Secondary | ICD-10-CM | POA: Insufficient documentation

## 2017-08-11 DIAGNOSIS — Z833 Family history of diabetes mellitus: Secondary | ICD-10-CM | POA: Insufficient documentation

## 2017-08-11 DIAGNOSIS — I10 Essential (primary) hypertension: Secondary | ICD-10-CM | POA: Insufficient documentation

## 2017-08-11 DIAGNOSIS — Z880 Allergy status to penicillin: Secondary | ICD-10-CM | POA: Insufficient documentation

## 2017-08-11 DIAGNOSIS — F17200 Nicotine dependence, unspecified, uncomplicated: Secondary | ICD-10-CM

## 2017-08-11 MED ORDER — METOPROLOL SUCCINATE ER 25 MG PO TB24: 25.0000 mg | ORAL_TABLET | Freq: Every day | ORAL | 3 refills | Status: DC
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
## 2017-08-11 NOTE — Patient Instructions (Addendum)
Coping with Quitting Smoking Quitting smoking is a physical and mental challenge. You will face cravings, withdrawal symptoms, and temptation. Before quitting, work with your health care provider to make a plan that can help you cope. Preparation can help you quit and keep you from giving in. How can I cope with cravings? Cravings usually last for 5-10 minutes. If you get through it, the craving will pass. Consider taking the following actions to help you cope with cravings:  Keep your mouth busy: ? Chew sugar-free gum. ? Suck on hard candies or a straw. ? Brush your teeth.  Keep your hands and body busy: ? Immediately change to a different activity when you feel a craving. ? Squeeze or play with a ball. ? Do an activity or a hobby, like making bead jewelry, practicing needlepoint, or working with wood. ? Mix up your normal routine. ? Take a short exercise break. Go for a quick walk or run up and down stairs. ? Spend time in public places where smoking is not allowed.  Focus on doing something kind or helpful for someone else.  Call a friend or family member to talk during a craving.  Join a support group.  Call a quit line, such as 1-800-QUIT-NOW.  Talk with your health care provider about medicines that might help you cope with cravings and make quitting easier for you.  How can I deal with withdrawal symptoms? Your body may experience negative effects as it tries to get used to not having nicotine in the system. These effects are called withdrawal symptoms. They may include:  Feeling hungrier than normal.  Trouble concentrating.  Irritability.  Trouble sleeping.  Feeling depressed.  Restlessness and agitation.  Craving a cigarette.  To manage withdrawal symptoms:  Avoid places, people, and activities that trigger your cravings.  Remember why you want to quit.  Get plenty of sleep.  Avoid coffee and other caffeinated drinks. These may worsen some of your  symptoms.  How can I handle social situations? Social situations can be difficult when you are quitting smoking, especially in the first few weeks. To manage this, you can:  Avoid parties, bars, and other social situations where people might be smoking.  Avoid alcohol.  Leave right away if you have the urge to smoke.  Explain to your family and friends that you are quitting smoking. Ask for understanding and support.  Plan activities with friends or family where smoking is not an option.  What are some ways I can cope with stress? Wanting to smoke may cause stress, and stress can make you want to smoke. Find ways to manage your stress. Relaxation techniques can help. For example:  Breathe slowly and deeply, in through your nose and out through your mouth.  Listen to soothing, relaxing music.  Talk with a family member or friend about your stress.  Light a candle.  Soak in a bath or take a shower.  Think about a peaceful place.  What are some ways I can prevent weight gain? Be aware that many people gain weight after they quit smoking. However, not everyone does. To keep from gaining weight, have a plan in place before you quit and stick to the plan after you quit. Your plan should include:  Having healthy snacks. When you have a craving, it may help to: ? Eat plain popcorn, crunchy carrots, celery, or other cut vegetables. ? Chew sugar-free gum.  Changing how you eat: ? Eat small portion sizes at meals. ?   Eat 4-6 small meals throughout the day instead of 1-2 large meals a day. ? Be mindful when you eat. Do not watch television or do other things that might distract you as you eat.  Exercising regularly: ? Make time to exercise each day. If you do not have time for a long workout, do short bouts of exercise for 5-10 minutes several times a day. ? Do some form of strengthening exercise, like weight lifting, and some form of aerobic exercise, like running or  swimming.  Drinking plenty of water or other low-calorie or no-calorie drinks. Drink 6-8 glasses of water daily, or as much as instructed by your health care provider.  Summary  Quitting smoking is a physical and mental challenge. You will face cravings, withdrawal symptoms, and temptation to smoke again. Preparation can help you as you go through these challenges.  You can cope with cravings by keeping your mouth busy (such as by chewing gum), keeping your body and hands busy, and making calls to family, friends, or a helpline for people who want to quit smoking.  You can cope with withdrawal symptoms by avoiding places where people smoke, avoiding drinks with caffeine, and getting plenty of rest.  Ask your health care provider about the different ways to prevent weight gain, avoid stress, and handle social situations. This information is not intended to replace advice given to you by your health care provider. Make sure you discuss any questions you have with your health care provider. Document Released: 07/31/2016 Document Revised: 07/31/2016 Document Reviewed: 07/31/2016 Elsevier Interactive Patient Education  2018 Elsevier Inc.  DASH Eating Plan DASH stands for "Dietary Approaches to Stop Hypertension." The DASH eating plan is a healthy eating plan that has been shown to reduce high blood pressure (hypertension). It may also reduce your risk for type 2 diabetes, heart disease, and stroke. The DASH eating plan may also help with weight loss. What are tips for following this plan? General guidelines  Avoid eating more than 2,300 mg (milligrams) of salt (sodium) a day. If you have hypertension, you may need to reduce your sodium intake to 1,500 mg a day.  Limit alcohol intake to no more than 1 drink a day for nonpregnant women and 2 drinks a day for men. One drink equals 12 oz of beer, 5 oz of wine, or 1 oz of hard liquor.  Work with your health care provider to maintain a healthy body  weight or to lose weight. Ask what an ideal weight is for you.  Get at least 30 minutes of exercise that causes your heart to beat faster (aerobic exercise) most days of the week. Activities may include walking, swimming, or biking.  Work with your health care provider or diet and nutrition specialist (dietitian) to adjust your eating plan to your individual calorie needs. Reading food labels  Check food labels for the amount of sodium per serving. Choose foods with less than 5 percent of the Daily Value of sodium. Generally, foods with less than 300 mg of sodium per serving fit into this eating plan.  To find whole grains, look for the word "whole" as the first word in the ingredient list. Shopping  Buy products labeled as "low-sodium" or "no salt added."  Buy fresh foods. Avoid canned foods and premade or frozen meals. Cooking  Avoid adding salt when cooking. Use salt-free seasonings or herbs instead of table salt or sea salt. Check with your health care provider or pharmacist before using salt substitutes.    Do not fry foods. Cook foods using healthy methods such as baking, boiling, grilling, and broiling instead.  Cook with heart-healthy oils, such as olive, canola, soybean, or sunflower oil. Meal planning   Eat a balanced diet that includes: ? 5 or more servings of fruits and vegetables each day. At each meal, try to fill half of your plate with fruits and vegetables. ? Up to 6-8 servings of whole grains each day. ? Less than 6 oz of lean meat, poultry, or fish each day. A 3-oz serving of meat is about the same size as a deck of cards. One egg equals 1 oz. ? 2 servings of low-fat dairy each day. ? A serving of nuts, seeds, or beans 5 times each week. ? Heart-healthy fats. Healthy fats called Omega-3 fatty acids are found in foods such as flaxseeds and coldwater fish, like sardines, salmon, and mackerel.  Limit how much you eat of the following: ? Canned or prepackaged  foods. ? Food that is high in trans fat, such as fried foods. ? Food that is high in saturated fat, such as fatty meat. ? Sweets, desserts, sugary drinks, and other foods with added sugar. ? Full-fat dairy products.  Do not salt foods before eating.  Try to eat at least 2 vegetarian meals each week.  Eat more home-cooked food and less restaurant, buffet, and fast food.  When eating at a restaurant, ask that your food be prepared with less salt or no salt, if possible. What foods are recommended? The items listed may not be a complete list. Talk with your dietitian about what dietary choices are best for you. Grains Whole-grain or whole-wheat bread. Whole-grain or whole-wheat pasta. Brown rice. [place]. Bulgur. Whole-grain and low-sodium cereals. Pita bread. Low-fat, low-sodium crackers. Whole-wheat flour tortillas. Vegetables Fresh or frozen vegetables (raw, steamed, roasted, or grilled). Low-sodium or reduced-sodium tomato and vegetable juice. Low-sodium or reduced-sodium tomato sauce and tomato paste. Low-sodium or reduced-sodium canned vegetables. Fruits All fresh, dried, or frozen fruit. Canned fruit in natural juice (without added sugar). Meat and other protein foods Skinless chicken or [place]. Ground chicken or [place]. Pork with fat trimmed off. Fish and seafood. Egg whites. Dried beans, peas, or lentils. Unsalted nuts, nut butters, and seeds. Unsalted canned beans. Lean cuts of beef with fat trimmed off. Low-sodium, lean deli meat. Dairy Low-fat (1%) or fat-free (skim) milk. Fat-free, low-fat, or reduced-fat cheeses. Nonfat, low-sodium ricotta or cottage cheese. Low-fat or nonfat yogurt. Low-fat, low-sodium cheese. Fats and oils Soft margarine without trans fats. Vegetable oil. Low-fat, reduced-fat, or light mayonnaise and salad dressings (reduced-sodium). Canola, safflower, olive, soybean, and sunflower oils. Avocado. Seasoning and other foods Herbs. Spices. Seasoning  mixes without salt. Unsalted popcorn and pretzels. Fat-free sweets. What foods are not recommended? The items listed may not be a complete list. Talk with your dietitian about what dietary choices are best for you. Grains Baked goods made with fat, such as croissants, muffins, or some breads. Dry pasta or rice meal packs. Vegetables Creamed or fried vegetables. Vegetables in a cheese sauce. Regular canned vegetables (not low-sodium or reduced-sodium). Regular canned tomato sauce and paste (not low-sodium or reduced-sodium). Regular tomato and vegetable juice (not low-sodium or reduced-sodium). Angie Fava. Olives. Fruits Canned fruit in a light or heavy syrup. Fried fruit. Fruit in cream or butter sauce. Meat and other protein foods Fatty cuts of meat. Ribs. Fried meat. Berniece Salines. Sausage. Bologna and other processed lunch meats. Salami. Fatback. Hotdogs. Bratwurst. Salted nuts and seeds. Canned beans  with added salt. Canned or smoked fish. Whole eggs or egg yolks. Chicken or [place] with skin. Dairy Whole or 2% milk, cream, and half-and-half. Whole or full-fat cream cheese. Whole-fat or sweetened yogurt. Full-fat cheese. Nondairy creamers. Whipped toppings. Processed cheese and cheese spreads. Fats and oils Butter. Stick margarine. Lard. Shortening. Ghee. Bacon fat. Tropical oils, such as coconut, palm kernel, or palm oil. Seasoning and other foods Salted popcorn and pretzels. Onion salt, garlic salt, seasoned salt, table salt, and sea salt. Worcestershire sauce. Tartar sauce. Barbecue sauce. Teriyaki sauce. Soy sauce, including reduced-sodium. Steak sauce. Canned and packaged gravies. Fish sauce. Oyster sauce. Cocktail sauce. Horseradish that you find on the shelf. Ketchup. Mustard. Meat flavorings and tenderizers. Bouillon cubes. Hot sauce and Tabasco sauce. Premade or packaged marinades. Premade or packaged taco seasonings. Relishes. Regular salad dressings. Where to find more information:  National  Heart, Lung, and Blood Institute: PopSteam.iswww.nhlbi.nih.gov  American Heart Association: www.heart.org Summary  The DASH eating plan is a healthy eating plan that has been shown to reduce high blood pressure (hypertension). It may also reduce your risk for type 2 diabetes, heart disease, and stroke.  With the DASH eating plan, you should limit salt (sodium) intake to 2,300 mg a day. If you have hypertension, you may need to reduce your sodium intake to 1,500 mg a day.  When on the DASH eating plan, aim to eat more fresh fruits and vegetables, whole grains, lean proteins, low-fat dairy, and heart-healthy fats.  Work with your health care provider or diet and nutrition specialist (dietitian) to adjust your eating plan to your individual calorie needs. This information is not intended to replace advice given to you by your health care provider. Make sure you discuss any questions you have with your health care provider. Document Released: 07/23/2011 Document Revised: 07/27/2016 Document Reviewed: 07/27/2016 Elsevier Interactive Patient Education  2018 [employer]  How to Take Your Blood Pressure You can take your blood pressure at home with a machine. You may need to check your blood pressure at home:  To check if you have high blood pressure (hypertension).  To check your blood pressure over time.  To make sure your blood pressure medicine is working.  Supplies needed: You will need a blood pressure machine, or monitor. You can buy one at a drugstore or online. When choosing one:  Choose one with an arm cuff.  Choose one that wraps around your upper arm. Only one finger should fit between your arm and the cuff.  Do not choose one that measures your blood pressure from your wrist or finger.  Your doctor can suggest a monitor. How to prepare Avoid these things for 30 minutes before checking your blood pressure:  Drinking caffeine.  Drinking  alcohol.  Eating.  Smoking.  Exercising.  Five minutes before checking your blood pressure:  Pee.  Sit in a dining chair. Avoid sitting in a soft couch or armchair.  Be quiet. Do not talk.  How to take your blood pressure Follow the instructions that came with your machine. If you have a digital blood pressure monitor, these may be the instructions: 1. Sit up straight. 2. Place your feet on the floor. Do not cross your ankles or legs. 3. Rest your left arm at the level of your heart. You may rest it on a table, desk, or chair. 4. Pull up your shirt sleeve. 5. Wrap the blood pressure cuff around the upper part of your left arm. The cuff should be  1 inch (2.5 cm) above your elbow. It is best to wrap the cuff around bare skin. 6. Fit the cuff snugly around your arm. You should be able to place only one finger between the cuff and your arm. 7. Put the cord inside the groove of your elbow. 8. Press the power button. 9. Sit quietly while the cuff fills with air and loses air. 10. Write down the numbers on the screen. 11. Wait 2-3 minutes and then repeat steps 1-10.  What do the numbers mean? Two numbers make up your blood pressure. The first number is called systolic pressure. The second is called diastolic pressure. An example of a blood pressure reading is "120 over 80" (or 120/80). If you are an adult and do not have a medical condition, use this guide to find out if your blood pressure is normal: Normal  First number: below 120.  Second number: below 80. Elevated  First number: 120-129.  Second number: below 80. Hypertension stage 1  First number: 130-139.  Second number: 80-89. Hypertension stage 2  First number: 140 or above.  Second number: 90 or above. Your blood pressure is above normal even if only the top or bottom number is above normal. Follow these instructions at home:  Check your blood pressure as often as your doctor tells you to.  Take your  monitor to your next doctor's appointment. Your doctor will: ? Make sure you are using it correctly. ? Make sure it is working right.  Make sure you understand what your blood pressure numbers should be.  Tell your doctor if your medicines are causing side effects. Contact a doctor if:  Your blood pressure keeps being high. Get help right away if:  Your first blood pressure number is higher than 180.  Your second blood pressure number is higher than 120. This information is not intended to replace advice given to you by your health care provider. Make sure you discuss any questions you have with your health care provider. Document Released: 07/16/2008 Document Revised: 07/01/2016 Document Reviewed: 01/10/2016 Elsevier Interactive Patient Education  [employer]2018 Elsevier Inc.

## 2017-08-20 ENCOUNTER — Telehealth: Payer: Self-pay | Admitting: General Practice

## 2017-08-20 NOTE — Telephone Encounter (Signed)
CMA attempt to call back patient. Patient was not home and left a message for patient to call back.

## 2017-08-20 NOTE — Telephone Encounter (Signed)
Pt called to request a call back in regards to a medication problem.She says she was prescribed a different medication as to the one she picked up

## 2017-09-07 ENCOUNTER — Ambulatory Visit: Payer: Self-pay | Admitting: Nurse Practitioner

## 2017-09-10 ENCOUNTER — Other Ambulatory Visit: Payer: Self-pay

## 2017-09-10 ENCOUNTER — Encounter: Payer: Self-pay | Admitting: Nurse Practitioner

## 2017-09-10 ENCOUNTER — Ambulatory Visit: Payer: Self-pay | Attending: Nurse Practitioner | Admitting: Nurse Practitioner

## 2017-09-10 VITALS — BP 162/103 | HR 98 | Temp 99.0°F | Ht 59.0 in | Wt 145.0 lb

## 2017-09-10 DIAGNOSIS — Z9889 Other specified postprocedural states: Secondary | ICD-10-CM | POA: Insufficient documentation

## 2017-09-10 DIAGNOSIS — Z825 Family history of asthma and other chronic lower respiratory diseases: Secondary | ICD-10-CM | POA: Insufficient documentation

## 2017-09-10 DIAGNOSIS — R509 Fever, unspecified: Secondary | ICD-10-CM

## 2017-09-10 DIAGNOSIS — Z79899 Other long term (current) drug therapy: Secondary | ICD-10-CM | POA: Insufficient documentation

## 2017-09-10 DIAGNOSIS — I10 Essential (primary) hypertension: Secondary | ICD-10-CM | POA: Insufficient documentation

## 2017-09-10 DIAGNOSIS — Z8249 Family history of ischemic heart disease and other diseases of the circulatory system: Secondary | ICD-10-CM | POA: Insufficient documentation

## 2017-09-10 DIAGNOSIS — Z888 Allergy status to other drugs, medicaments and biological substances status: Secondary | ICD-10-CM | POA: Insufficient documentation

## 2017-09-10 DIAGNOSIS — R05 Cough: Secondary | ICD-10-CM | POA: Insufficient documentation

## 2017-09-10 DIAGNOSIS — Z880 Allergy status to penicillin: Secondary | ICD-10-CM | POA: Insufficient documentation

## 2017-09-10 DIAGNOSIS — Z833 Family history of diabetes mellitus: Secondary | ICD-10-CM | POA: Insufficient documentation

## 2017-09-10 DIAGNOSIS — F17200 Nicotine dependence, unspecified, uncomplicated: Secondary | ICD-10-CM

## 2017-09-10 DIAGNOSIS — R059 Cough, unspecified: Secondary | ICD-10-CM

## 2017-09-10 MED ORDER — CLONIDINE HCL 0.1 MG PO TABS
0.2000 mg | ORAL_TABLET | Freq: Once | ORAL | Status: AC
  Administered 2017-09-10: 0.2 mg via ORAL

## 2017-09-10 MED ORDER — METOPROLOL SUCCINATE ER 50 MG PO TB24: 50.0000 mg | ORAL_TABLET | Freq: Every day | ORAL | 1 refills | Status: DC

## 2017-09-10 NOTE — Progress Notes (Signed)
Assessment & Plan:  Kerry [name] was seen today for follow-up.  Diagnoses and all orders for this visit:  Essential hypertension -     cloNIDine (CATAPRES) tablet 0.2 mg -     CBC -     Basic metabolic panel -     Lipid panel -     metoprolol succinate (TOPROL-XL) 50 MG 24 hr tablet; Take 1 tablet (50 mg total) by mouth daily.  INSTRUCTIONS: Continue all antihypertensives as prescribed.  Remember to bring in your blood pressure log with you for your follow up appointment.  DASH/Mediterranean Diets are healthier choices for HTN.    Cough with fever -     CBC  Tobacco dependence Kerry [name] was counseled on the dangers of tobacco use, and was advised to quit. Reviewed strategies to maximize success, including removing cigarettes and smoking materials from environment, stress management and support of family/friends as well as pharmacological alternatives including: Wellbutrin, Chantix, Nicotine patch, Nicotine gum or lozenges. Smoking cessation support: smoking cessation hotline: 1-800-QUIT-NOW.  Smoking cessation classes are also available through [facility]. Call [phone] or visit our website at [email or web address].   Spent 3 minutes counseling on smoking cessation and patient is not ready to quit.  Patient has been counseled on age-appropriate routine health concerns for screening and prevention. These are reviewed and up-to-date. Referrals have been placed accordingly. Immunizations are up-to-date or declined.    Subjective:   Chief Complaint  Patient presents with  . Follow-up    Patient is here for a follow-up for recheck blood pressure and for labs. Patient stated she have not ate anything yet. Patient stated she have problem taking her Metoprolol.    HPI Kerry [name] K Maher 52 y.o. female presents to office today for blood pressure recheck.   Essential Hypertension Her blood pressure was poorly controlled at her last office visit on 08-11-2017. At that  time she was endorsing difficulty swallowing the HCTZ that had been prescribed previously in the ED and I switched her HCTZ to metoprolol succinate 25mg  daily. Today her blood pressure is still uncontrolled. Will increase her metoprolol to 50mg . She endorses difficulty swallowing these pills as well. However she has no difficulty swallowing foods or liquids. She has a cough and slightly elevated temp today. She also states she does not like taking medicine. She is a smoker and drinks alcohol. She may need an endoscopy. She is due for colonoscopy as well. She currently denies enies chest pain, shortness of breath, palpitations, lightheadedness, dizziness, headaches or BLE edema.   Review of Systems  Constitutional: Positive for fever. Negative for malaise/fatigue and weight loss.  Eyes: Negative.  Negative for blurred vision, double vision and photophobia.  Respiratory: Positive for cough. Negative for shortness of breath.   Cardiovascular: Negative.  Negative for chest pain, palpitations and leg swelling.  Gastrointestinal: Negative.  Negative for abdominal pain, constipation, diarrhea, heartburn, nausea and vomiting.  Neurological: Negative.  Negative for dizziness, focal weakness, seizures and headaches.  Endo/Heme/Allergies: Negative for environmental allergies.  Psychiatric/Behavioral: Negative.  Negative for suicidal ideas.    History reviewed. No pertinent past medical history.  Past Surgical History:  Procedure Laterality Date  . KNEE CARTILAGE SURGERY Right   . KNEE SURGERY      Family History  Problem Relation Age of Onset  . Diabetes Mother   . Hypertension Mother   . Asthma Sister   . Hypertension Sister   . Hypertension Brother   . Diabetes Brother  Social History Reviewed with no changes to be made today.   Outpatient Medications Prior to Visit  Medication Sig Dispense Refill  . metoprolol succinate (TOPROL-XL) 25 MG 24 hr tablet Take 1 tablet (25 mg total) by mouth  daily. 90 tablet 3  . fluticasone (FLONASE) 50 MCG/ACT nasal spray Place 2 sprays into both nostrils daily. (Patient not taking: Reported on 08/11/2017) 16 g 0   No facility-administered medications prior to visit.     Allergies  Allergen Reactions  . Diphenhydramine Other (See Comments)    Topical burned skin  . Amoxicillin Rash       Objective:    BP (!) 162/103 (BP Location: Left Arm, Cuff Size: Normal)   Pulse 98   Temp 99 F (37.2 C) (Oral)   Ht 4\' 11"  (1.499 m)   Wt 145 lb (65.8 kg)   LMP 07/17/2013   SpO2 95%   BMI 29.29 kg/m  Wt Readings from Last 3 Encounters:  09/10/17 145 lb (65.8 kg)  08/11/17 143 lb 3.2 oz (65 kg)  07/21/17 230 lb (104.3 kg)     She denies chest pain, shortness of breath. She does endorse She     Physical Exam  Constitutional: She is oriented to person, place, and time. She appears well-developed and well-nourished. She is cooperative.  HENT:  Head: Normocephalic and atraumatic.  Eyes: EOM are normal.  Neck: Normal range of motion.  Cardiovascular: Normal rate, regular rhythm and intact distal pulses.  No extrasystoles are present. Exam reveals no gallop and no friction rub.  Murmur heard. Pulmonary/Chest: Effort normal. No tachypnea. No respiratory distress. She has no decreased breath sounds. She has no wheezes. She has rhonchi in the right middle field, the right lower field, the left middle field and the left lower field. She has no rales. She exhibits no tenderness.  Abdominal: Soft. Bowel sounds are normal.  Musculoskeletal: Normal range of motion. She exhibits no edema.  Neurological: She is alert and oriented to person, place, and time. Coordination normal.  Skin: Skin is warm and dry.  Psychiatric: She has a normal mood and affect. Her behavior is normal. Judgment and thought content normal.  Nursing note and vitals reviewed.        Patient has been counseled extensively about nutrition and exercise as well as the  importance of adherence with medications and regular follow-up. The patient was given clear instructions to go to ER or return to medical center if symptoms don't improve, worsen or new problems develop. The patient verbalized understanding.   Follow-up: Return in about 2 weeks (around 09/24/2017) for BP recheck.   Claiborne RiggZelda W Kashon Kraynak, FNP-BC [facility] [place], [place] [phone]   09/13/2017, 9:29 PM

## 2017-09-11 LAB — LIPID PANEL
Chol/HDL Ratio: 2.2 ratio (ref 0.0–4.4)
Cholesterol, Total: 180 mg/dL (ref 100–199)
HDL: 81 mg/dL (ref >39)
LDL Calculated: 88 mg/dL (ref 0–99)
Triglycerides: 55 mg/dL (ref 0–149)
VLDL CHOLESTEROL CAL: 11 mg/dL (ref 5–40)

## 2017-09-11 LAB — CBC
Hematocrit: 31.4 % — ABNORMAL LOW (ref 34.0–46.6)
Hemoglobin: 10.2 g/dL — ABNORMAL LOW (ref 11.1–15.9)
MCH: 24.9 pg — AB (ref 26.6–33.0)
MCHC: 32.5 g/dL (ref 31.5–35.7)
MCV: 77 fL — AB (ref 79–97)
PLATELETS: 344 10*3/uL (ref 150–379)
RBC: 4.09 x10E6/uL (ref 3.77–5.28)
RDW: 18.1 % — ABNORMAL HIGH (ref 12.3–15.4)
WBC: 8.3 10*3/uL (ref 3.4–10.8)

## 2017-09-11 LAB — BASIC METABOLIC PANEL
BUN / CREAT RATIO: 20 (ref 9–23)
BUN: 12 mg/dL (ref 6–24)
CO2: 23 mmol/L (ref 20–29)
CREATININE: 0.59 mg/dL (ref 0.57–1.00)
Calcium: 9.2 mg/dL (ref 8.7–10.2)
Chloride: 99 mmol/L (ref 96–106)
GFR calc non Af Amer: 105 mL/min/{1.73_m2} (ref >59)
GFR, EST AFRICAN AMERICAN: 121 mL/min/{1.73_m2} (ref >59)
Glucose: 105 mg/dL — ABNORMAL HIGH (ref 65–99)
Potassium: 3.7 mmol/L (ref 3.5–5.2)
Sodium: 139 mmol/L (ref 134–144)

## 2017-09-13 ENCOUNTER — Encounter: Payer: Self-pay | Admitting: Nurse Practitioner

## 2017-09-13 ENCOUNTER — Other Ambulatory Visit: Payer: Self-pay | Admitting: Nurse Practitioner

## 2017-09-13 DIAGNOSIS — D509 Iron deficiency anemia, unspecified: Secondary | ICD-10-CM

## 2017-09-13 DIAGNOSIS — Z1211 Encounter for screening for malignant neoplasm of colon: Secondary | ICD-10-CM

## 2017-09-14 ENCOUNTER — Telehealth: Payer: Self-pay

## 2017-09-14 NOTE — Telephone Encounter (Signed)
-----   Message from Zelda W Fleming, NP sent at 09/13/2017  1:18 AM EST ----- All labs are normal except your CBC shows iron deficiency anemia. As you are post menopausal and do not have menstrual cycles anymore, it is extremely important that we have a colonscopy scheduled for you as well as complete your pap smear. Please make sure you also follow up with the financial representative in the office so that we can make sure you are reviewed for the appropriate financial resources. Please take ferrous sulfate 325mg or iron tablets every other day. These pills are absorbed better with orange juice. 

## 2017-09-14 NOTE — Telephone Encounter (Signed)
-----   Message from Claiborne RiggZelda W Fleming, NP sent at 09/13/2017  1:18 AM EST ----- All labs are normal except your CBC shows iron deficiency anemia. As you are post menopausal and do not have menstrual cycles anymore, it is extremely important that we have a colonscopy scheduled for you as well as complete your pap smear. Please make sure you also follow up with the financial representative in the office so that we can make sure you are reviewed for the appropriate financial resources. Please take ferrous sulfate 325mg  or iron tablets every other day. These pills are absorbed better with orange juice.

## 2017-09-14 NOTE — Telephone Encounter (Signed)
CMA attempt to call patient regarding lab results.   Patient did not answer and no voicemail was set up.   Communication letter will be send out.

## 2017-09-22 ENCOUNTER — Encounter: Payer: Self-pay | Admitting: Nurse Practitioner

## 2017-09-22 ENCOUNTER — Ambulatory Visit: Payer: Self-pay | Attending: Nurse Practitioner | Admitting: Nurse Practitioner

## 2017-09-22 VITALS — BP 193/105 | HR 82 | Temp 98.5°F | Ht 59.0 in | Wt 146.6 lb

## 2017-09-22 DIAGNOSIS — Z880 Allergy status to penicillin: Secondary | ICD-10-CM | POA: Insufficient documentation

## 2017-09-22 DIAGNOSIS — I447 Left bundle-branch block, unspecified: Secondary | ICD-10-CM | POA: Insufficient documentation

## 2017-09-22 DIAGNOSIS — I10 Essential (primary) hypertension: Secondary | ICD-10-CM | POA: Insufficient documentation

## 2017-09-22 DIAGNOSIS — Z888 Allergy status to other drugs, medicaments and biological substances status: Secondary | ICD-10-CM | POA: Insufficient documentation

## 2017-09-22 DIAGNOSIS — Z79899 Other long term (current) drug therapy: Secondary | ICD-10-CM | POA: Insufficient documentation

## 2017-09-22 DIAGNOSIS — Z9889 Other specified postprocedural states: Secondary | ICD-10-CM | POA: Insufficient documentation

## 2017-09-22 DIAGNOSIS — R1310 Dysphagia, unspecified: Secondary | ICD-10-CM | POA: Insufficient documentation

## 2017-09-22 DIAGNOSIS — R0981 Nasal congestion: Secondary | ICD-10-CM | POA: Insufficient documentation

## 2017-09-22 MED ORDER — CLONIDINE HCL 0.1 MG PO TABS
0.2000 mg | ORAL_TABLET | Freq: Once | ORAL | Status: AC
  Administered 2017-09-22: 0.2 mg via ORAL

## 2017-09-22 MED ORDER — METOPROLOL SUCCINATE ER 100 MG PO TB24: 100.0000 mg | ORAL_TABLET | Freq: Every day | ORAL | 2 refills | Status: DC

## 2017-09-22 MED ORDER — FLUTICASONE PROPIONATE 50 MCG/ACT NA SUSP: 2.0000 | Freq: Every day | NASAL | 0 refills | Status: DC

## 2017-09-22 NOTE — Progress Notes (Signed)
Assessment & Plan:  Kerry was seen today for blood pressure check.  Diagnoses and all orders for this visit:  Essential hypertension -     cloNIDine (CATAPRES) tablet 0.2 mg -     metoprolol succinate (TOPROL-XL) 100 MG 24 hr tablet; Take 1 tablet (100 mg total) by mouth daily. -     Ambulatory referral to Cardiology Continue all antihypertensives as prescribed.  Remember to bring in your blood pressure log with you for your follow up appointment.  DASH/Mediterranean Diets are healthier choices for HTN.    New onset left bundle branch block (LBBB) -     Ambulatory referral to Cardiology  Congestion of nasal sinus -     fluticasone (FLONASE) 50 MCG/ACT nasal spray; Place 2 sprays into both nostrils daily. INSTRUCTIONS: use a humidifier for nasal congestion    Patient has been counseled on age-appropriate routine health concerns for screening and prevention. These are reviewed and up-to-date. Referrals have been placed accordingly. Immunizations are up-to-date or declined.    Subjective:   Chief Complaint  Patient presents with  . Blood Pressure Check    Patient is here for blood pressure check.    HPI Kerry [name] 52 y.o. female presents to office today for blood pressure recheck. She is still having nasal congestion. She has been taking a nasal spray but can not recall the name of it.   Essential Hypertension Currently taking Toprol XL 50mg  daily. She is still smoking. Blood pressure has been poorly controlled despite several medication adjustments. She was initially started on HCTZ 25mg  in December during an ED visit unrelated to her blood pressure. After being started on HCTZ she then began to complain of difficulty swallowing HCTZ and requested to be placed on an alternative medication. Because she was experiencing tachycardia as well, I switched her to metoprolol XL 25mg . She was seen a few weeks later on 09-10-2017 and still noted for hypertension despite her endorsing  medication compliance. At that time I increased her Toprol XL to 50mg  daily. Today blood pressure is still uncontrolled. She endorses medication compliance. I am not sure how forthcoming she is with taking her medications as prescribed. She has a history of alcohol abuse as well. Denies chest pain, shortness of breath, palpitations, lightheadedness, dizziness, headaches or BLE edema. Her heart rate is more controlled and she is no longer tachycardic. She has an EKG with new LBBB on 07-21-2017. EKG from 04-15-2015 did not show a LBBB.  BP Readings from Last 3 Encounters:  09/22/17 (!) 193/105  09/10/17 (!) 162/103  08/11/17 (!) 149/77     Review of Systems  Constitutional: Negative for fever, malaise/fatigue and weight loss.  HENT: Positive for congestion. Negative for nosebleeds.   Eyes: Negative.  Negative for blurred vision, double vision and photophobia.  Respiratory: Negative.  Negative for cough and shortness of breath.   Cardiovascular: Negative.  Negative for chest pain, palpitations and leg swelling.  Neurological: Negative.  Negative for dizziness, focal weakness, seizures and headaches.  Endo/Heme/Allergies: Negative for environmental allergies.  Psychiatric/Behavioral: Negative.  Negative for suicidal ideas.    History reviewed. No pertinent past medical history.  Past Surgical History:  Procedure Laterality Date  . KNEE CARTILAGE SURGERY Right   . KNEE SURGERY      Family History  Problem Relation Age of Onset  . Diabetes Mother   . Hypertension Mother   . Asthma Sister   . Hypertension Sister   . Hypertension Brother   .  Diabetes Brother     Social History Reviewed with no changes to be made today.   Outpatient Medications Prior to Visit  Medication Sig Dispense Refill  . fluticasone (FLONASE) 50 MCG/ACT nasal spray Place 2 sprays into both nostrils daily. 16 g 0  . metoprolol succinate (TOPROL-XL) 50 MG 24 hr tablet Take 1 tablet (50 mg total) by mouth daily. 90  tablet 1   No facility-administered medications prior to visit.     Allergies  Allergen Reactions  . Diphenhydramine Other (See Comments)    Topical burned skin  . Amoxicillin Rash       Objective:    BP (!) 193/105 (BP Location: Left Arm, Cuff Size: Normal)   Pulse 82   Temp 98.5 F (36.9 C) (Oral)   Ht 4\' 11"  (1.499 m)   Wt 146 lb 9.6 oz (66.5 kg)   LMP 07/17/2013   SpO2 97%   BMI 29.61 kg/m  Wt Readings from Last 3 Encounters:  09/22/17 146 lb 9.6 oz (66.5 kg)  09/10/17 145 lb (65.8 kg)  08/11/17 143 lb 3.2 oz (65 kg)    Physical Exam  Constitutional: She is oriented to person, place, and time. She appears well-developed and well-nourished. She is cooperative.  HENT:  Head: Normocephalic and atraumatic.  Right Ear: Hearing, external ear and ear canal normal. A middle ear effusion is present.  Left Ear: Hearing, external ear and ear canal normal. A middle ear effusion is present.  Nose: Mucosal edema and rhinorrhea present.  Mouth/Throat: Uvula is midline, oropharynx is clear and moist and mucous membranes are normal.  Eyes: EOM are normal.  Neck: Normal range of motion.  Cardiovascular: Normal rate and regular rhythm. Exam reveals no gallop and no friction rub.  Murmur heard. Pulmonary/Chest: Effort normal and breath sounds normal. No tachypnea. No respiratory distress. She has no decreased breath sounds. She has no wheezes. She has no rhonchi. She has no rales. She exhibits no tenderness.  Musculoskeletal: Normal range of motion. She exhibits no edema.  Neurological: She is alert and oriented to person, place, and time. Coordination normal.  Skin: Skin is warm and dry.  Psychiatric: She has a normal mood and affect. Her behavior is normal. Judgment and thought content normal.  Nursing note and vitals reviewed.     Patient has been counseled extensively about nutrition and exercise as well as the importance of adherence with medications and regular follow-up. The  patient was given clear instructions to go to ER or return to medical center if symptoms don't improve, worsen or new problems develop. The patient verbalized understanding.   Follow-up: Return in about 3 weeks (around 10/13/2017) for BP recheck.   Claiborne RiggZelda W , FNP-BC [facility] [place], [place] [phone]   09/24/2017, 10:52 PM

## 2017-09-24 ENCOUNTER — Encounter: Payer: Self-pay | Admitting: Nurse Practitioner

## 2017-10-11 ENCOUNTER — Ambulatory Visit: Payer: Self-pay | Admitting: Nurse Practitioner

## 2017-10-13 ENCOUNTER — Ambulatory Visit: Payer: Self-pay

## 2017-10-13 ENCOUNTER — Other Ambulatory Visit: Payer: Self-pay | Admitting: Obstetrics and Gynecology

## 2017-10-13 DIAGNOSIS — Z1231 Encounter for screening mammogram for malignant neoplasm of breast: Secondary | ICD-10-CM

## 2017-10-18 ENCOUNTER — Telehealth: Payer: Self-pay | Admitting: Nurse Practitioner

## 2017-10-18 DIAGNOSIS — I10 Essential (primary) hypertension: Secondary | ICD-10-CM

## 2017-10-18 MED ORDER — METOPROLOL SUCCINATE ER 100 MG PO TB24: 100.0000 mg | ORAL_TABLET | Freq: Every day | ORAL | 2 refills | Status: DC

## 2017-10-18 NOTE — Telephone Encounter (Signed)
Refilled

## 2017-10-18 NOTE — Telephone Encounter (Signed)
Pt called to get a refill for metoprolol succinate (TOPROL-XL) 100 MG 24 hr tablet Please sent [facility] to [facility] [place] Please follow up

## 2017-10-27 ENCOUNTER — Ambulatory Visit: Payer: Self-pay | Attending: Nurse Practitioner

## 2017-11-04 ENCOUNTER — Ambulatory Visit
Admission: RE | Admit: 2017-11-04 | Discharge: 2017-11-04 | Disposition: A | Discharge status: Home / Self Care | Payer: No Typology Code available for payment source | Source: Ambulatory Visit | Attending: Obstetrics and Gynecology | Admitting: Obstetrics and Gynecology

## 2017-11-04 ENCOUNTER — Encounter (HOSPITAL_COMMUNITY): Payer: Self-pay

## 2017-11-04 ENCOUNTER — Ambulatory Visit (HOSPITAL_COMMUNITY)
Admission: RE | Admit: 2017-11-04 | Discharge: 2017-11-04 | Disposition: A | Discharge status: Home / Self Care | Payer: Self-pay | Source: Ambulatory Visit | Attending: Obstetrics and Gynecology | Admitting: Obstetrics and Gynecology

## 2017-11-04 VITALS — Ht 59.0 in

## 2017-11-04 DIAGNOSIS — Z1231 Encounter for screening mammogram for malignant neoplasm of breast: Secondary | ICD-10-CM

## 2017-11-04 DIAGNOSIS — Z01419 Encounter for gynecological examination (general) (routine) without abnormal findings: Secondary | ICD-10-CM

## 2017-11-04 NOTE — Progress Notes (Signed)
Complaints of Post-menopausal bleeding in October 2018. Per patient has not had a menstrual cycle for three years. Will refer to the Center [facility] for follow-up.  Pap Smear: Pap smear completed today. Last Pap smear was 25-30 years ago and normal. Per patient has no history of an abnormal Pap smear. No Pap smear results are in Epic.  Physical exam: Breasts Breasts symmetrical. No skin abnormalities bilateral breasts. No nipple retraction bilateral breasts. No nipple discharge bilateral breasts. No lymphadenopathy. No lumps palpated bilateral breasts. No complaints of pain or tenderness on exam. [facility]. Appointment scheduled for Thursday, November 04, 2017 at 1110.        Pelvic/Bimanual   Ext Genitalia No lesions, no swelling and no discharge observed on external genitalia.         Vagina Vagina pink and normal texture. No lesions or discharge observed in vagina.          Cervix Cervix is present. Cervix pink and of normal texture. No discharge observed.     Uterus Uterus is present and palpable. Uterus in normal position and enlarged. Will refer to [facility] for follow-up.      Adnexae Bilateral ovaries present and palpable. No tenderness on palpation.         Rectovaginal No rectal exam completed today since patient had no rectal complaints. No skin abnormalities observed on exam.    Smoking History: Patient is a current smoker. Discussed smoking cessation with patient. Referred [facility] the [facility] and gave resources to the free smoking cessation classes at [facility].  Patient Navigation: Patient education provided. Access to services provided for patient through [facility] program.   Colorectal Cancer Screening: Per patient has never had a colonoscopy completed. No complaints today. FIT Test given to patient to complete and return to [facility].  Breast and Cervical Cancer Risk  Assessment: Patient has a family history of her mother being diagnosed with breast cancer. Patient has no known genetic mutations or history of radiation treatment to the chest before age 52. Patient has no history of cervical dysplasia, immunocompromised, or DES exposure in-utero.

## 2017-11-04 NOTE — Addendum Note (Signed)
Encounter addended by: Lynnell DikeHolland, Nevin Kozuch H, LPN on: 4/69/62953/21/2019 10:27 AM  Actions taken: Order list changed

## 2017-11-04 NOTE — Patient Instructions (Signed)
Explained breast self awareness with Kerry [name]. Let patient know [facility] will cover Pap smears and HPV typing every 5 years unless has a history of abnormal Pap smears. [facility]. Appointment scheduled for Thursday, November 04, 2017 at 1110. Let patient know will follow up with her within the next couple weeks with results of Pap smear by letter or phone. Informed patient that the Breast Center will follow-up with her within the next couple of weeks with results of mammogram by letter or phone. Let patient know that will send referral to the [facility] to follow-up for enlarged uterus and post-menopausal bleeding. Told patient that she should receive a phone call from the [facility] within a couple of weeks to schedule appointment. Discussed smoking cessation with patient. Referred to the [facility] and gave resources to the free smoking cessation classes at [facility]. Kerry [name] verbalized understanding.  Kerry [name], Kerry Maserhristine Poll, RN 10:16 AM

## 2017-11-05 LAB — CYTOLOGY - PAP
DIAGNOSIS: NEGATIVE
HPV: NOT DETECTED

## 2017-11-08 ENCOUNTER — Other Ambulatory Visit: Payer: Self-pay

## 2017-11-09 ENCOUNTER — Encounter: Payer: Self-pay | Admitting: Nurse Practitioner

## 2017-11-09 ENCOUNTER — Ambulatory Visit: Payer: Self-pay | Attending: Nurse Practitioner | Admitting: Nurse Practitioner

## 2017-11-09 ENCOUNTER — Telehealth (HOSPITAL_COMMUNITY): Payer: Self-pay | Admitting: *Deleted

## 2017-11-09 ENCOUNTER — Encounter (HOSPITAL_COMMUNITY): Payer: Self-pay | Admitting: *Deleted

## 2017-11-09 ENCOUNTER — Other Ambulatory Visit (HOSPITAL_COMMUNITY): Payer: Self-pay | Admitting: *Deleted

## 2017-11-09 VITALS — BP 178/124 | HR 74 | Temp 98.9°F | Ht 59.0 in | Wt 144.6 lb

## 2017-11-09 DIAGNOSIS — Z888 Allergy status to other drugs, medicaments and biological substances status: Secondary | ICD-10-CM | POA: Insufficient documentation

## 2017-11-09 DIAGNOSIS — Z8249 Family history of ischemic heart disease and other diseases of the circulatory system: Secondary | ICD-10-CM | POA: Insufficient documentation

## 2017-11-09 DIAGNOSIS — Z79899 Other long term (current) drug therapy: Secondary | ICD-10-CM | POA: Insufficient documentation

## 2017-11-09 DIAGNOSIS — I447 Left bundle-branch block, unspecified: Secondary | ICD-10-CM | POA: Insufficient documentation

## 2017-11-09 DIAGNOSIS — I10 Essential (primary) hypertension: Secondary | ICD-10-CM | POA: Insufficient documentation

## 2017-11-09 DIAGNOSIS — F17200 Nicotine dependence, unspecified, uncomplicated: Secondary | ICD-10-CM

## 2017-11-09 DIAGNOSIS — Z09 Encounter for follow-up examination after completed treatment for conditions other than malignant neoplasm: Secondary | ICD-10-CM | POA: Insufficient documentation

## 2017-11-09 DIAGNOSIS — F17210 Nicotine dependence, cigarettes, uncomplicated: Secondary | ICD-10-CM | POA: Insufficient documentation

## 2017-11-09 DIAGNOSIS — Z880 Allergy status to penicillin: Secondary | ICD-10-CM | POA: Insufficient documentation

## 2017-11-09 DIAGNOSIS — Z825 Family history of asthma and other chronic lower respiratory diseases: Secondary | ICD-10-CM | POA: Insufficient documentation

## 2017-11-09 DIAGNOSIS — Z9889 Other specified postprocedural states: Secondary | ICD-10-CM | POA: Insufficient documentation

## 2017-11-09 DIAGNOSIS — Z833 Family history of diabetes mellitus: Secondary | ICD-10-CM | POA: Insufficient documentation

## 2017-11-09 MED ORDER — METOPROLOL SUCCINATE ER 100 MG PO TB24: 100.0000 mg | ORAL_TABLET | Freq: Every day | ORAL | 2 refills | Status: DC

## 2017-11-09 MED ORDER — AMLODIPINE BESYLATE 5 MG PO TABS: 5.0000 mg | ORAL_TABLET | Freq: Every day | ORAL | 3 refills | Status: DC

## 2017-11-09 MED ORDER — METRONIDAZOLE 500 MG PO TABS: 500.0000 mg | ORAL_TABLET | Freq: Two times a day (BID) | ORAL | 0 refills | Status: DC

## 2017-11-09 MED ORDER — CLONIDINE HCL 0.1 MG PO TABS
0.2000 mg | ORAL_TABLET | Freq: Once | ORAL | Status: AC
  Administered 2017-11-09: 0.2 mg via ORAL

## 2017-11-09 NOTE — Progress Notes (Signed)
Assessment & Plan:  Kerry [name] was seen today for follow-up.  Diagnoses and all orders for this visit:  Essential hypertension -     cloNIDine (CATAPRES) tablet 0.2 mg -     amLODipine (NORVASC) 5 MG tablet; Take 1 tablet (5 mg total) by mouth daily. -     metoprolol succinate (TOPROL-XL) 100 MG 24 hr tablet; Take 1 tablet (100 mg total) by mouth daily.  Continue all antihypertensives as prescribed.  Remember to bring in your blood pressure log with you for your follow up appointment.  DASH/Mediterranean Diets are healthier choices for HTN.   Tobacco dependence Kerry [name] was counseled on the dangers of tobacco use, and was advised to quit. Reviewed strategies to maximize success, including removing cigarettes and smoking materials from environment, stress management and support of family/friends as well as pharmacological alternatives including: Wellbutrin, Chantix, Nicotine patch, Nicotine gum or lozenges. Smoking cessation support: smoking cessation hotline: 1-800-QUIT-NOW.  Smoking cessation classes are also available through [facility]. Call 714-017-5348 or visit our website at [email or web address].   A total of 3 minutes was spent on counseling for smoking cessation and Kerry [name] is not ready to quit.     Patient has been counseled on age-appropriate routine health concerns for screening and prevention. These are reviewed and up-to-date. Referrals have been placed accordingly. Immunizations are up-to-date or declined.    Subjective:   Chief Complaint  Patient presents with  . Follow-up    Pt is here for a blood pressure check.    HPI Kerry [name] 52 y.o. female presents to office today for follow up/BP recheck. BP is still poorly controlled. She was referred to cardiology and still has not followed up. I have instructed her to call the cardiology office and schedule an appointment since they have been trying to contact her.   Essential Hypertension Blood  pressure is poorly controlled. She endorses medication compliance but I am not sure how forthcoming she is regarding this. I will add norvasc 5mg  to her toprol XL 100 mg. Denies chest pain, shortness of breath, palpitations, lightheadedness, dizziness, headaches or BLE edema. She continues to smoke. BP Readings from Last 3 Encounters:  11/09/17 (!) 178/124  09/22/17 (!) 193/105  09/10/17 (!) 162/103    Review of Systems  Constitutional: Negative for fever, malaise/fatigue and weight loss.  HENT: Negative.  Negative for nosebleeds.   Eyes: Negative.  Negative for blurred vision, double vision and photophobia.  Respiratory: Negative.  Negative for cough and shortness of breath.   Cardiovascular: Negative.  Negative for chest pain, palpitations and leg swelling.  Gastrointestinal: Negative.  Negative for heartburn, nausea and vomiting.  Musculoskeletal: Negative.  Negative for myalgias.  Neurological: Negative.  Negative for dizziness, focal weakness, seizures and headaches.  Psychiatric/Behavioral: Negative.  Negative for suicidal ideas.    Past Medical History:  Diagnosis Date  . Hypertension   . Left bundle branch block (LBBB) on electrocardiogram   . Substance abuse (HCC)    ETOH ABUSE    Past Surgical History:  Procedure Laterality Date  . KNEE CARTILAGE SURGERY Right   . KNEE SURGERY      Family History  Problem Relation Age of Onset  . Diabetes Mother   . Hypertension Mother   . Asthma Sister   . Hypertension Sister   . Hypertension Brother   . Diabetes Brother     Social History Reviewed with no changes to be made today.   Outpatient Medications Prior to  Visit  Medication Sig Dispense Refill  . metoprolol succinate (TOPROL-XL) 100 MG 24 hr tablet Take 1 tablet (100 mg total) by mouth daily. 30 tablet 2  . fluticasone (FLONASE) 50 MCG/ACT nasal spray Place 2 sprays into both nostrils daily. (Patient not taking: Reported on 11/09/2017) 16 g 0  . metroNIDAZOLE  (FLAGYL) 500 MG tablet Take 1 tablet (500 mg total) by mouth 2 (two) times daily. (Patient not taking: Reported on 11/09/2017) 14 tablet 0   No facility-administered medications prior to visit.     Allergies  Allergen Reactions  . Diphenhydramine Other (See Comments)    Topical burned skin  . Amoxicillin Rash       Objective:    BP (!) 178/124 (BP Location: Right Arm, Cuff Size: Normal)   Pulse 74   Temp 98.9 F (37.2 C) (Oral)   Ht 4\' 11"  (1.499 m)   Wt 144 lb 9.6 oz (65.6 kg)   LMP 07/17/2013   SpO2 98%   BMI 29.21 kg/m  Wt Readings from Last 3 Encounters:  11/09/17 144 lb 9.6 oz (65.6 kg)  09/22/17 146 lb 9.6 oz (66.5 kg)  09/10/17 145 lb (65.8 kg)    Physical Exam  Constitutional: She is oriented to person, place, and time. She appears well-developed and well-nourished. She is cooperative.  HENT:  Head: Normocephalic and atraumatic.  Eyes: EOM are normal.  Neck: Normal range of motion.  Cardiovascular: Normal rate and regular rhythm. Exam reveals no gallop and no friction rub.  Murmur heard. Pulmonary/Chest: Effort normal and breath sounds normal. No tachypnea. No respiratory distress. She has no decreased breath sounds. She has no wheezes. She has no rhonchi. She has no rales. She exhibits no tenderness.  Abdominal: Soft. Bowel sounds are normal.  Musculoskeletal: Normal range of motion. She exhibits no edema.  Neurological: She is alert and oriented to person, place, and time. Coordination normal.  Skin: Skin is warm and dry.  Psychiatric: She has a normal mood and affect. Her behavior is normal. Judgment and thought content normal.  Nursing note and vitals reviewed.      Patient has been counseled extensively about nutrition and exercise as well as the importance of adherence with medications and regular follow-up. The patient was given clear instructions to go to ER or return to medical center if symptoms don't improve, worsen or new problems develop. The  patient verbalized understanding.   Follow-up: Return in about 2 weeks (around 11/23/2017) for BP recheck.   Claiborne RiggZelda W Fleming, FNP-BC [facility] [place], [place] [phone]   11/09/2017, 3:55 PM

## 2017-11-09 NOTE — Patient Instructions (Addendum)
Please call [facility] and let them know you have been referred and you are returning their call to schedule:  Address: [address], [place], [place] [address] Phone: [phone]    Left Bundle Branch Block Left bundle branch block (LBBB) is a problem with the way that electrical impulses pass through the heart (electrical conduction abnormality). The heart depends on an electrical pulse to beat normally. The electrical signal for a heartbeat starts in the upper chambers of the heart (atria) and then travels to the two lower chambers (left and rightventricles). An LBBB is a partial or complete block of the pathway that carries the signal to the left ventricle. If you have LBBB, the left side of your heart does not beat normally. LBBB may be a warning sign of heart disease. What are the causes? This condition may be caused by:  Heart disease.  Disease of the arteries in the heart (arteriosclerosis).  Stiffening or weakening of heart muscle (cardiomyopathy).  Infection of heart muscle (myocarditis).  High blood pressure (hypertension).  In some cases, the cause may not be known. What increases the risk? The following factors may make you more likely to develop this condition:  Being female.  Being 54 years of age or older.  Having heart disease.  Having had a heart attack or heart surgery.  What are the signs or symptoms? This condition may not cause any symptoms. If you do have symptoms, they may include:  Feeling dizzy or light-headed.  Fainting.  How is this diagnosed? This condition may be diagnosed based on an electrocardiogram (ECG). It is often diagnosed when an ECG is done as part of a routine physical or to help find the cause of fainting spells. You may also have imaging tests to find out more about your condition. These may include:  Chest X-rays.  Echo test (echocardiogram).  How is this treated? If you do not have symptoms or any other  type of heart disease, you may not need treatment for this condition. However, you may need to see your health care provider more often because LBBB can be a warning sign of future heart problems. You may get treatment for other heart problems or high blood pressure. If LBBB causes symptoms or other heart problems, you may need to have an electrical device (pacemaker) implanted under the skin of your chest. A pacemaker sends electrical signals to your heart to keep it beating normally. Follow these instructions at home:  Follow instructions from your health care provider about eating or drinking restrictions.  Take over-the-counter and prescription medicines only as told by your health care provider.  Return to your normal activities as told by your health care provider. Ask your health care provider what activities are safe for you.  Follow a heart-healthy diet and maintain a healthy weight. Work with a diet and nutrition specialist (dietitian) to create an eating plan that is best for you.  Get regular exercise as told by your health care provider.  Do not use any products that contain nicotine or tobacco, such as cigarettes and e-cigarettes. If you need help quitting, ask your health care provider.  Keep all follow-up visits as told by your health care provider. This is important. Contact a health care provider if:  You are light-headed or you faint. Get help right away if:  You have chest pain.  You have difficulty breathing. These symptoms may represent a serious problem that is an emergency. Do not wait to  see if the symptoms will go away. Get medical help right away. Call your local emergency services (911 in the [place] Do not drive yourself to the hospital. Summary  For the heart to beat normally, an electrical signal must travel to the lower left chamber of the heart (left ventricle). Left bundle branch block (LBBB) is a partial or complete block of the pathway that carries that  signal.  This condition may not cause any symptoms. In some cases, a person may feel dizzy or light-headed or may faint.  Treatment may not be needed for LBBB if you do not have symptoms or any other type of heart disease.  You may need to see your health care provider more often because LBBB can be a warning sign of future heart problems. This information is not intended to replace advice given to you by your health care provider. Make sure you discuss any questions you have with your health care provider. Document Released: 09/22/2016 Document Revised: 09/22/2016 Document Reviewed: 09/22/2016 Elsevier Interactive Patient Education  2018 [employer].

## 2017-11-09 NOTE — Progress Notes (Signed)
co

## 2017-11-09 NOTE — Telephone Encounter (Signed)
Telephoned patient at home number and advised patient of negative pap smear results. HPV was negative. Next pap smear due in five years.  Advised patient pap smear did show trichomonas. Medication [facility]. Advised patient to finish all medication, no sexual contact, no alcohol while taking medication, and partner also needs to be treated. Patient voiced understanding.

## 2017-11-12 ENCOUNTER — Telehealth: Payer: Self-pay | Admitting: Nurse Practitioner

## 2017-11-12 NOTE — Telephone Encounter (Signed)
Error

## 2017-11-14 LAB — FECAL OCCULT BLOOD, IMMUNOCHEMICAL: FECAL OCCULT BLD: NEGATIVE

## 2017-11-15 ENCOUNTER — Encounter (HOSPITAL_COMMUNITY): Payer: Self-pay

## 2017-11-30 ENCOUNTER — Ambulatory Visit: Payer: Self-pay | Attending: Family Medicine | Admitting: *Deleted

## 2017-11-30 DIAGNOSIS — I10 Essential (primary) hypertension: Secondary | ICD-10-CM

## 2017-11-30 MED ORDER — METOPROLOL SUCCINATE ER 100 MG PO TB24: 100.0000 mg | ORAL_TABLET | Freq: Every day | ORAL | 1 refills | Status: DC

## 2017-11-30 MED ORDER — AMLODIPINE BESYLATE 5 MG PO TABS: 5.0000 mg | ORAL_TABLET | Freq: Every day | ORAL | 2 refills | Status: DC

## 2017-12-05 NOTE — Progress Notes (Signed)
Cardiology Office Note   Date:  12/06/2017   ID:  Peggye LeyRenete K Espino, DOB 04/14/1966, MRN [id number]  PCP:  Claiborne RiggFleming, Zelda W, NP  Cardiologist:   No primary care provider on file. Referring:  Claiborne RiggFleming, Zelda W, NP  Chief Complaint  Patient presents with  . Aortic Stenosis      History of Present Illness: Kerry [name] is a 52 y.o. female who is referred by Claiborne RiggFleming, Zelda W, NP for evaluation of LBBB.  He has no past cardiac history.  She has had hypertension treated.  She denies any cardiovascular symptoms.  She does her usual activities such as grocery shopping.  On the weekend she likes to dance.  With this she denies any cardiovascular symptoms. The patient denies any new symptoms such as chest discomfort, neck or arm discomfort. There has been no new shortness of breath, PND or orthopnea. There have been no reported palpitations, presyncope or syncope.         Past Medical History:  Diagnosis Date  . Alcohol use    Drinks on weekends per her report  . Hypertension   . Left bundle branch block (LBBB) on electrocardiogram     Past Surgical History:  Procedure Laterality Date  . KNEE CARTILAGE SURGERY Right   . KNEE SURGERY       Current Outpatient Medications  Medication Sig Dispense Refill  . amLODipine (NORVASC) 5 MG tablet Take 1 tablet (5 mg total) by mouth daily. 30 tablet 2  . fluticasone (FLONASE) 50 MCG/ACT nasal spray Place 2 sprays into both nostrils daily. 16 g 0  . metoprolol succinate (TOPROL-XL) 50 MG 24 hr tablet Take 3 tablets (150 mg total) by mouth daily. 90 tablet 11   No current facility-administered medications for this visit.     Allergies:   Diphenhydramine and Amoxicillin    Social History:  The patient  reports that she has been smoking cigarettes.  She has been smoking about 0.50 packs per day. She has never used smokeless tobacco. She reports that she drinks about 3.6 oz of alcohol per week. She reports that she does not use drugs.    Family History:  The patient's family history includes Asthma in her sister; Diabetes in her brother and mother; Hypertension in her brother, mother, and sister.    ROS:  Please see the history of present illness.   Otherwise, review of systems are positive for none.   All other systems are reviewed and negative.    PHYSICAL EXAM: VS:  BP (!) 148/87   Pulse 99   Ht 4\' 11"  (1.499 m)   Wt 141 lb 9.6 oz (64.2 kg)   LMP 07/17/2013   BMI 28.60 kg/m  , BMI Body mass index is 28.6 kg/m. GENERAL:  Well appearing HEENT:  Pupils equal round and reactive, fundi not visualized, oral mucosa unremarkable NECK:  No jugular venous distention, waveform within normal limits, carotid upstroke brisk and symmetric, no bruits, no thyromegaly LYMPHATICS:  No cervical, inguinal adenopathy LUNGS:  Clear to auscultation bilaterally BACK:  No CVA tenderness CHEST:  Unremarkable HEART:  PMI not displaced or sustained,S1 and S2 within normal limits, no S3, no S4, no clicks, no rubs, soft brief left upper sternal border systolic murmur, no diastolic murmurs ABD:  Flat, positive bowel sounds normal in frequency in pitch, no bruits, no rebound, no guarding, no midline pulsatile mass, no hepatomegaly, no splenomegaly EXT:  2 plus pulses throughout, no edema, no cyanosis no clubbing  SKIN:  No rashes no nodules NEURO:  Cranial nerves II through XII grossly intact, motor grossly intact throughout PSYCH:  Cognitively intact, oriented to person place and time    EKG:  EKG is not ordered today. The ekg ordered 09/10/17 demonstrates sinus rhythm, rate 79, left bundle-branch block   Recent Labs: 07/21/2017: ALT 20 09/10/2017: BUN 12; Creatinine, Ser 0.59; Hemoglobin 10.2; Platelets 344; Potassium 3.7; Sodium 139    Lipid Panel    Component Value Date/Time   CHOL 180 09/10/2017 1209   TRIG 55 09/10/2017 1209   HDL 81 09/10/2017 1209   CHOLHDL 2.2 09/10/2017 1209   CHOLHDL 2.3 11/21/2008 1733   VLDL 8  11/21/2008 1733   LDLCALC 88 09/10/2017 1209      Wt Readings from Last 3 Encounters:  12/06/17 141 lb 9.6 oz (64.2 kg)  11/09/17 144 lb 9.6 oz (65.6 kg)  09/22/17 146 lb 9.6 oz (66.5 kg)      Other studies Reviewed: Additional studies/ records that were reviewed today include: Labs. Review of the above records demonstrates:  Please see elsewhere in the note.     ASSESSMENT AND PLAN:  LBBB:    We will check an echocardiogram.  Otherwise since she has no symptoms she will likely need no further workup.  HTN: I suspect that this is not well controlled and I will increase her metoprolol to 150 mg daily.  I am going to check a TSH.  I suggested that she get a BP diary.   TOBACCO:  She was educated.    ETOH:  She reports to me that she only drinks on the weekends.  She was listed as having ETOH abuse but she denies this.     Current medicines are reviewed at length with the patient today.  The patient does not have concerns regarding medicines.  The following changes have been made:  As above  Labs/ tests ordered today include:   Orders Placed This Encounter  Procedures  . TSH  . ECHOCARDIOGRAM COMPLETE     Disposition:   FU with me as needed based on the results of the above    Signed, Rollene Rotunda, MD  12/06/2017 10:59 AM    [facility]

## 2017-12-06 ENCOUNTER — Ambulatory Visit (INDEPENDENT_AMBULATORY_CARE_PROVIDER_SITE_OTHER): Payer: Self-pay | Admitting: Cardiology

## 2017-12-06 ENCOUNTER — Encounter: Payer: Self-pay | Admitting: Cardiology

## 2017-12-06 VITALS — BP 148/87 | HR 99 | Ht 59.0 in | Wt 141.6 lb

## 2017-12-06 DIAGNOSIS — I10 Essential (primary) hypertension: Secondary | ICD-10-CM

## 2017-12-06 DIAGNOSIS — R5383 Other fatigue: Secondary | ICD-10-CM | POA: Insufficient documentation

## 2017-12-06 DIAGNOSIS — I350 Nonrheumatic aortic (valve) stenosis: Secondary | ICD-10-CM | POA: Insufficient documentation

## 2017-12-06 DIAGNOSIS — I447 Left bundle-branch block, unspecified: Secondary | ICD-10-CM

## 2017-12-06 DIAGNOSIS — R9431 Abnormal electrocardiogram [ECG] [EKG]: Secondary | ICD-10-CM | POA: Insufficient documentation

## 2017-12-06 MED ORDER — METOPROLOL SUCCINATE ER 50 MG PO TB24: 150.0000 mg | ORAL_TABLET | Freq: Every day | ORAL | 11 refills | Status: DC

## 2017-12-06 NOTE — Patient Instructions (Signed)
Medication Instructions:  INCREASE- Metoprolol 150 mg(3 tablets) daily  If you need a refill on your cardiac medications before your next appointment, please call your pharmacy.  Labwork: TSH Today HERE IN OUR OFFICE AT [facility]  Take the provided lab slips with you to the lab for your blood draw.   You will NOT need to fast   Testing/Procedures: Your physician has requested that you have an echocardiogram. Echocardiography is a painless test that uses sound waves to create images of your heart. It provides your doctor with information about the size and shape of your heart and how well your heart's chambers and valves are working. This procedure takes approximately one hour. There are no restrictions for this procedure.   Follow-Up: Your physician wants you to follow-up in: As Needed.     Thank you for choosing [facility] at [facility]!!

## 2017-12-07 LAB — TSH: TSH: 0.578 u[IU]/mL (ref 0.450–4.500)

## 2017-12-07 NOTE — Progress Notes (Signed)
Kerry [name] arrived to [facility] alert and oriented in good spirits. She  is here for nurse visit to check blood pressure. At last OV on 11/09/2017 with PCP, her blood pressure reading was 178/124.   Pt denies chest pain, SOB, HA, dizziness, or blurred vision. Verified medication with patient. Pt states medication was taken this morning.  Will route nurse visit to PCP.  Blood pressure reading: 147/94. Pt has appointment with Cardiologist on 12/06/2017. Will route nurse visit to PCP. Please advise.

## 2017-12-09 NOTE — Progress Notes (Signed)
Metoprolol has been increased by Cardiology. Patient has been instructed.

## 2017-12-15 ENCOUNTER — Other Ambulatory Visit (HOSPITAL_COMMUNITY): Payer: Self-pay

## 2017-12-23 ENCOUNTER — Other Ambulatory Visit: Payer: Self-pay

## 2017-12-23 ENCOUNTER — Ambulatory Visit (HOSPITAL_COMMUNITY): Payer: Self-pay | Attending: Internal Medicine

## 2017-12-23 DIAGNOSIS — I119 Hypertensive heart disease without heart failure: Secondary | ICD-10-CM | POA: Insufficient documentation

## 2017-12-23 DIAGNOSIS — I081 Rheumatic disorders of both mitral and tricuspid valves: Secondary | ICD-10-CM | POA: Insufficient documentation

## 2017-12-23 DIAGNOSIS — I447 Left bundle-branch block, unspecified: Secondary | ICD-10-CM | POA: Insufficient documentation

## 2017-12-29 ENCOUNTER — Encounter: Payer: No Typology Code available for payment source | Admitting: Obstetrics and Gynecology

## 2017-12-29 ENCOUNTER — Telehealth: Payer: Self-pay | Admitting: *Deleted

## 2017-12-29 NOTE — Telephone Encounter (Signed)
Kerry [name] did not keep her scheduled appointment for enlarged uterus/ PMB. I called Promise's mobile number and heard message her voicemail not set up- unable to leave a message. I called her home number and notified her she missed her appointment.  She states she would like to reschedule it. Will have front office call her to reschedule.

## 2018-01-24 ENCOUNTER — Telehealth: Payer: Self-pay | Admitting: General Practice

## 2018-01-24 NOTE — Telephone Encounter (Signed)
Opened in error

## 2018-02-09 ENCOUNTER — Encounter: Payer: Self-pay | Admitting: Obstetrics and Gynecology

## 2018-03-10 ENCOUNTER — Encounter: Payer: Self-pay | Admitting: Obstetrics & Gynecology

## 2020-01-29 ENCOUNTER — Other Ambulatory Visit: Payer: Self-pay

## 2020-01-29 ENCOUNTER — Encounter (HOSPITAL_COMMUNITY): Payer: Self-pay

## 2020-01-29 ENCOUNTER — Ambulatory Visit (HOSPITAL_COMMUNITY)
Admission: EM | Admit: 2020-01-29 | Discharge: 2020-01-29 | Disposition: A | Discharge status: Home / Self Care | Payer: Self-pay | Attending: Physician Assistant | Admitting: Physician Assistant

## 2020-01-29 DIAGNOSIS — M25561 Pain in right knee: Secondary | ICD-10-CM

## 2020-01-29 MED ORDER — DICLOFENAC SODIUM 1 % EX GEL: 4.0000 g | Freq: Four times a day (QID) | CUTANEOUS | 0 refills | Status: DC

## 2020-01-29 NOTE — ED Provider Notes (Signed)
[facility]    CSN: [id number] Arrival date & time: 01/29/20  1221      History   Chief Complaint Chief Complaint  Patient presents with  . Knee Pain  . Hip Pain    HPI Kerry [name] is a 54 y.o. female.   Patient presents for evaluation of right knee pain.  She reports the pain starts in the right knee and radiates up her leg.  She reports the pain is present for at least 2 months.  She reports previous issues with her right knee and meniscus surgery several years ago with [facility].  She reports the pain is worse with walking and she feels a catch at times.  She reports the pain is not there at all times.  She reports occasionally feeling the knee will give out.  She denies significant swelling in the knee.  She has not had any recent injuries or falls.  She has not seen her primary care in some time.  She has not followed up with orthopedics in some time.     Patient denies any chest pain or shortness of breath.  Has not had any lower extremity swelling.     Past Medical History:  Diagnosis Date  . Alcohol use    Drinks on weekends per her report  . Hypertension   . Left bundle branch block (LBBB) on electrocardiogram     Patient Active Problem List   Diagnosis Date Noted  . Nonrheumatic aortic valve stenosis 12/06/2017  . Other fatigue 12/06/2017  . Abnormal ECG 12/06/2017  . Essential hypertension 08/11/2017    Past Surgical History:  Procedure Laterality Date  . KNEE CARTILAGE SURGERY Right   . KNEE SURGERY      OB History    Gravida  3   Para      Term      Preterm      AB      Living  2     SAB      TAB      Ectopic      Multiple      Live Births  2            Home Medications    Prior to Admission medications   Medication Sig Start Date End Date Taking? Authorizing Provider  amLODipine (NORVASC) 5 MG tablet Take 1 tablet (5 mg total) by mouth daily. 11/30/17   Claiborne Rigg, NP  diclofenac Sodium  (VOLTAREN) 1 % GEL Apply 4 g topically 4 (four) times daily. 01/29/20   Draedyn Weidinger, Veryl Speak, PA-C  fluticasone (FLONASE) 50 MCG/ACT nasal spray Place 2 sprays into both nostrils daily. 09/22/17   Claiborne Rigg, NP  metoprolol succinate (TOPROL-XL) 50 MG 24 hr tablet Take 3 tablets (150 mg total) by mouth daily. 12/06/17 01/05/18  Rollene Rotunda, MD    Family History Family History  Problem Relation Age of Onset  . Diabetes Mother   . Hypertension Mother   . Asthma Sister   . Hypertension Sister   . Hypertension Brother   . Diabetes Brother     Social History Social History   Tobacco Use  . Smoking status: Current Every Day Smoker    Packs/day: 0.50    Types: Cigarettes  . Smokeless tobacco: Never Used  Vaping Use  . Vaping Use: Never used  Substance Use Topics  . Alcohol use: Yes    Alcohol/week: 6.0 standard drinks    Types: 6  Cans of beer per week  . Drug use: No     Allergies   Diphenhydramine and Amoxicillin   Review of Systems Review of Systems   Physical Exam Triage Vital Signs ED Triage Vitals  Enc Vitals Group     BP 01/29/20 1303 (!) 168/97     Pulse Rate 01/29/20 1303 (!) 110     Resp 01/29/20 1303 17     Temp 01/29/20 1303 98.3 F (36.8 C)     Temp Source 01/29/20 1303 Oral     SpO2 01/29/20 1303 98 %     Weight --      Height --      Head Circumference --      Peak Flow --      Pain Score 01/29/20 1302 8     Pain Loc --      Pain Edu? --      Excl. in [place]? --    No data found.  Updated Vital Signs BP (!) 168/97 (BP Location: Right Arm)   Pulse (!) 110   Temp 98.3 F (36.8 C) (Oral)   Resp 17   LMP 07/17/2013   SpO2 98%   Visual Acuity Right Eye Distance:   Left Eye Distance:   Bilateral Distance:    Right Eye Near:   Left Eye Near:    Bilateral Near:     Physical Exam Vitals and nursing note reviewed.  Constitutional:      General: She is not in acute distress.    Appearance: She is well-developed. She is not ill-appearing.    HENT:     Head: Normocephalic and atraumatic.  Eyes:     Conjunctiva/sclera: Conjunctivae normal.  Cardiovascular:     Rate and Rhythm: Regular rhythm. Tachycardia present.     Heart sounds: No murmur heard.  No friction rub. No gallop.   Pulmonary:     Effort: Pulmonary effort is normal. No respiratory distress.     Breath sounds: Normal breath sounds. No wheezing, rhonchi or rales.  Abdominal:     Palpations: Abdomen is soft.     Tenderness: There is no abdominal tenderness.  Musculoskeletal:     Cervical back: Neck supple.     Comments: Right knee without effusion.  There is no bony tenderness.  Some pain elicited with McMurray's.  Joint stable with anterior and posterior drawer test.  No varus or valgus laxity.  No quadriceps tendon tenderness or patella ligament tenderness.  Patella is free-floating without pain.  Skin:    General: Skin is warm and dry.  Neurological:     General: No focal deficit present.     Mental Status: She is alert and oriented to person, place, and time.      UC Treatments / Results  Labs (all labs ordered are listed, but only abnormal results are displayed) Labs Reviewed - No data to display  EKG   Radiology No results found.  Procedures Procedures (including critical care time)  Medications Ordered in UC Medications - No data to display  Initial Impression / Assessment and Plan / UC Course  I have reviewed the triage vital signs and the nursing notes.  Pertinent labs & imaging results that were available during my care of the patient were reviewed by me and considered in my medical decision making (see chart for details).     #Right knee pain Patient is a 54 year old presenting with right knee pain.  Given history suspect continued issues with meniscus possible  osteoarthritis.  Patient has tachycardia in clinic however is asymptomatic and sounds regular.  We will have her follow-up with [facility] given the previously  operated on the knee.  Instructed patient to follow-up with primary care for evaluation of her chronic medical conditions.  Strict emergency department cautions for any palpitations, chest pain or shortness of breath.  Will use diclofenac gel for knee.  Patient verbalized agreement and understands plan. Final Clinical Impressions(s) / UC Diagnoses   Final diagnoses:  Right knee pain, unspecified chronicity     Discharge Instructions     Apply the gel to the knee up to 4 times a day You may also take tylenol for pain relief  Contact [facility] who did your previous surgery for follow up, they also have Walk in clinic hours  [facility]) [phone] [facility] [place], [place] [address] Walk in hours 5-9 mon- Friday  You need to follow up with your primary care for blood pressure and chronic medical conditions  If you have chest pain, shortness of breath or feeling of fast heart rate go to the Emergency department      ED Prescriptions    Medication Sig Dispense Auth. Provider   diclofenac Sodium (VOLTAREN) 1 % GEL Apply 4 g topically 4 (four) times daily. 100 g Tierra Thoma, Veryl Speak, PA-C     I have reviewed the PDMP during this encounter.   Hermelinda Medicus, PA-C 01/29/20 2056

## 2020-01-29 NOTE — Discharge Instructions (Signed)
Apply the gel to the knee up to 4 times a day You may also take tylenol for pain relief  Contact [facility] who did your previous surgery for follow up, they also have Walk in clinic hours  [facility]) [phone] [place], [place] [address] Walk in hours 5-9 mon- Friday  You need to follow up with your primary care for blood pressure and chronic medical conditions  If you have chest pain, shortness of breath or feeling of fast heart rate go to the Emergency department

## 2020-01-29 NOTE — ED Triage Notes (Signed)
Patient reports she has chronic leg and hip pain x2 months.

## 2020-03-12 ENCOUNTER — Ambulatory Visit: Payer: Self-pay

## 2020-03-12 ENCOUNTER — Other Ambulatory Visit: Payer: Self-pay

## 2020-03-12 ENCOUNTER — Ambulatory Visit: Payer: Self-pay | Admitting: *Deleted

## 2020-03-12 DIAGNOSIS — Z23 Encounter for immunization: Secondary | ICD-10-CM

## 2020-04-09 ENCOUNTER — Ambulatory Visit: Payer: Self-pay | Attending: Critical Care Medicine

## 2020-04-09 ENCOUNTER — Ambulatory Visit: Payer: Self-pay | Admitting: *Deleted

## 2020-04-09 ENCOUNTER — Other Ambulatory Visit: Payer: Self-pay

## 2020-04-09 DIAGNOSIS — Z23 Encounter for immunization: Secondary | ICD-10-CM

## 2020-04-09 NOTE — Progress Notes (Signed)
   Covid-19 Vaccination Clinic  Name:  Kerry [name]    MRN: [id number] DOB: [birth year]  04/23/2020  [name] was observed post Covid-19 immunization for 15 minutes without incident. She was provided with Vaccine Information Sheet and instruction to access the V-Safe system.   [name] was instructed to call 911 with any severe reactions post vaccine: Difficulty breathing  Swelling of face and throat  A fast heartbeat  A bad rash all over body  Dizziness and weakness

## 2020-04-16 ENCOUNTER — Ambulatory Visit: Payer: Self-pay

## 2020-04-17 NOTE — Progress Notes (Signed)
Patient tolerated injection well today 

## 2020-08-06 ENCOUNTER — Ambulatory Visit: Payer: Self-pay

## 2020-08-06 DIAGNOSIS — Z23 Encounter for immunization: Secondary | ICD-10-CM

## 2020-08-06 NOTE — Progress Notes (Signed)
Flu vaccine administered to R deltiod, pt tolerated well w/o adverse reaction noted

## 2021-09-19 ENCOUNTER — Emergency Department (HOSPITAL_COMMUNITY)
Admission: EM | Admit: 2021-09-19 | Discharge: 2021-09-20 | Disposition: A | Discharge status: Home / Self Care | Payer: 59 | Attending: Emergency Medicine | Admitting: Emergency Medicine

## 2021-09-19 ENCOUNTER — Encounter (HOSPITAL_COMMUNITY): Payer: Self-pay | Admitting: *Deleted

## 2021-09-19 ENCOUNTER — Other Ambulatory Visit: Payer: Self-pay

## 2021-09-19 DIAGNOSIS — M25562 Pain in left knee: Secondary | ICD-10-CM | POA: Diagnosis not present

## 2021-09-19 DIAGNOSIS — G8929 Other chronic pain: Secondary | ICD-10-CM | POA: Diagnosis not present

## 2021-09-19 DIAGNOSIS — E876 Hypokalemia: Secondary | ICD-10-CM | POA: Diagnosis not present

## 2021-09-19 DIAGNOSIS — I10 Essential (primary) hypertension: Secondary | ICD-10-CM

## 2021-09-19 DIAGNOSIS — R030 Elevated blood-pressure reading, without diagnosis of hypertension: Secondary | ICD-10-CM | POA: Diagnosis not present

## 2021-09-19 DIAGNOSIS — M25561 Pain in right knee: Secondary | ICD-10-CM | POA: Diagnosis present

## 2021-09-19 DIAGNOSIS — Z79899 Other long term (current) drug therapy: Secondary | ICD-10-CM | POA: Insufficient documentation

## 2021-09-19 MED ORDER — NAPROXEN 250 MG PO TABS
500.0000 mg | ORAL_TABLET | Freq: Once | ORAL | Status: AC
  Administered 2021-09-19: 500 mg via ORAL
  Filled 2021-09-19: qty 2

## 2021-09-19 NOTE — ED Triage Notes (Signed)
The pt arrived by w/c from [facility] she was dancing and she reports that she is hiurting all over her body  she has also been drinking and appears intoxicated

## 2021-09-19 NOTE — ED Provider Triage Note (Signed)
Emergency Medicine Provider Triage Evaluation Note  Kerry [name] , a 56 y.o. female  was evaluated in triage.  Pt complains of chronic pain in b/l knees radiating to b/l hips x 1 year. States that pain hurts more when the weather changes, like when it rains. She cannot explain what prompted her ED visit tonight. Denies any falls. No fevers, bowel/bladder incontinence. Has taken BC powders in the past for pain. Hx of ETOH use and HTN. Had prior right knee and meniscus surgery several years ago with [facility].  Review of Systems  Positive: As above Negative: As above  Physical Exam  LMP 07/17/2013  Gen:   Awake, no distress   Resp:  Normal effort  MSK:   Moves extremities without difficulty  Other:  Concern for clinical intoxication  Medical Decision Making  Medically screening exam initiated at 10:29 PM.  Appropriate orders placed.  Kerry [name] was informed that the remainder of the evaluation will be completed by another provider, this initial triage assessment does not replace that evaluation, and the importance of remaining in the ED until their evaluation is complete.  Joint pain, chronic - will given Naproxen pending formal evaluation   Antony Madura, PA-C 09/19/21 2237

## 2021-09-20 ENCOUNTER — Emergency Department (HOSPITAL_COMMUNITY): Payer: 59

## 2021-09-20 LAB — CBC WITH DIFFERENTIAL/PLATELET
Abs Immature Granulocytes: 0.01 10*3/uL (ref 0.00–0.07)
Basophils Absolute: 0 10*3/uL (ref 0.0–0.1)
Basophils Relative: 1 %
Eosinophils Absolute: 0 10*3/uL (ref 0.0–0.5)
Eosinophils Relative: 1 %
HCT: 35.1 % — ABNORMAL LOW (ref 36.0–46.0)
Hemoglobin: 12 g/dL (ref 12.0–15.0)
Immature Granulocytes: 0 %
Lymphocytes Relative: 39 %
Lymphs Abs: 2.2 10*3/uL (ref 0.7–4.0)
MCH: 32.1 pg (ref 26.0–34.0)
MCHC: 34.2 g/dL (ref 30.0–36.0)
MCV: 93.9 fL (ref 80.0–100.0)
Monocytes Absolute: 0.6 10*3/uL (ref 0.1–1.0)
Monocytes Relative: 10 %
Neutro Abs: 2.9 10*3/uL (ref 1.7–7.7)
Neutrophils Relative %: 49 %
Platelets: 295 10*3/uL (ref 150–400)
RBC: 3.74 MIL/uL — ABNORMAL LOW (ref 3.87–5.11)
RDW: 12.8 % (ref 11.5–15.5)
WBC: 5.7 10*3/uL (ref 4.0–10.5)
nRBC: 0 % (ref 0.0–0.2)

## 2021-09-20 LAB — BASIC METABOLIC PANEL
Anion gap: 11 (ref 5–15)
BUN: 10 mg/dL (ref 6–20)
CO2: 24 mmol/L (ref 22–32)
Calcium: 9.1 mg/dL (ref 8.9–10.3)
Chloride: 106 mmol/L (ref 98–111)
Creatinine, Ser: 0.59 mg/dL (ref 0.44–1.00)
GFR, Estimated: >60 mL/min (ref >60)
Glucose, Bld: 88 mg/dL (ref 70–99)
Potassium: 3.3 mmol/L — ABNORMAL LOW (ref 3.5–5.1)
Sodium: 141 mmol/L (ref 135–145)

## 2021-09-20 MED ORDER — NAPROXEN 250 MG PO TABS
500.0000 mg | ORAL_TABLET | Freq: Once | ORAL | Status: AC
  Administered 2021-09-20: 500 mg via ORAL
  Filled 2021-09-20: qty 2

## 2021-09-20 MED ORDER — POTASSIUM CHLORIDE CRYS ER 20 MEQ PO TBCR
40.0000 meq | EXTENDED_RELEASE_TABLET | Freq: Once | ORAL | Status: AC
  Administered 2021-09-20: 40 meq via ORAL
  Filled 2021-09-20: qty 2

## 2021-09-20 MED ORDER — NAPROXEN 500 MG PO TABS: 500.0000 mg | ORAL_TABLET | Freq: Two times a day (BID) | ORAL | 0 refills | Status: DC

## 2021-09-20 MED ORDER — POTASSIUM CHLORIDE CRYS ER 20 MEQ PO TBCR: 20.0000 meq | EXTENDED_RELEASE_TABLET | Freq: Two times a day (BID) | ORAL | 0 refills | Status: DC

## 2021-09-20 NOTE — Discharge Instructions (Signed)
Try applying ice to your knees for 30 minutes at a time, 4 times a day.  You have been given a prescription for naproxen.  Please take it twice a day.  If you feel you need additional pain relief, you may add acetaminophen.

## 2021-09-20 NOTE — ED Provider Notes (Signed)
[facility] EMERGENCY DEPARTMENT Provider Note   CSN: [id number] Arrival date & time: 09/19/21  2220     History  Chief Complaint  Patient presents with   Hip Pain    Kerry [name] is a 56 y.o. female.  The history is provided by the patient.  Hip Pain She has history of hypertension comes in because of bilateral knee pain which sometimes radiates up towards her hips.  She is a very poor historian, but pain apparently has been present for at least 2 years.  It seems to be worse when she is in cold weather or damp weather.  She takes [facility] powder which does give her some temporary relief of pain.  She does endorse prior knee surgery but denies any trauma.  She does state that sometimes her knees will feel like they lock up and she can sometimes fall when that happens.  Symptoms have been stable over time.  She cannot tell me why she chose today to come into the hospital.   Home Medications Prior to Admission medications   Medication Sig Start Date End Date Taking? Authorizing Provider  amLODipine (NORVASC) 5 MG tablet Take 1 tablet (5 mg total) by mouth daily. 11/30/17   Claiborne Rigg, NP  diclofenac Sodium (VOLTAREN) 1 % GEL Apply 4 g topically 4 (four) times daily. 01/29/20   Darr, Gerilyn Pilgrim, PA-C  fluticasone (FLONASE) 50 MCG/ACT nasal spray Place 2 sprays into both nostrils daily. 09/22/17   Claiborne Rigg, NP  metoprolol succinate (TOPROL-XL) 50 MG 24 hr tablet Take 3 tablets (150 mg total) by mouth daily. 12/06/17 01/05/18  Rollene Rotunda, MD      Allergies    Diphenhydramine and Amoxicillin    Review of Systems   Review of Systems  All other systems reviewed and are negative.  Physical Exam Updated Vital Signs BP (!) 145/68    Pulse 70    Temp 98.3 F (36.8 C) (Oral)    Resp 16    Ht 4\' 11"  (1.499 m)    Wt 64.2 kg    LMP 07/17/2013    SpO2 99%    BMI 28.59 kg/m  Physical Exam Vitals and nursing note reviewed.  56 year old female, resting comfortably and  in no acute distress. Vital signs are significant for mildly elevated blood pressure. Oxygen saturation is 99%, which is normal. Head is normocephalic and atraumatic. PERRLA, EOMI. Oropharynx is clear. Neck is nontender and supple without adenopathy or JVD. Back is nontender and there is no CVA tenderness. Lungs are clear without rales, wheezes, or rhonchi. Chest is nontender. Heart has regular rate and rhythm without murmur. Abdomen is soft, flat, nontender. Extremities: There is no swelling or deformity of the knees.  No effusion is present.  There is no instability.  She does complain of pain with passive range of motion of both hips and both knees.  There is no peripheral edema. Skin is warm and dry without rash. Neurologic: Mental status is normal, cranial nerves are intact, moves all extremities equally.  ED Results / Procedures / Treatments   Labs (all labs ordered are listed, but only abnormal results are displayed) Labs Reviewed  BASIC METABOLIC PANEL - Abnormal; Notable for the following components:      Result Value   Potassium 3.3 (*)    All other components within normal limits  CBC WITH DIFFERENTIAL/PLATELET - Abnormal; Notable for the following components:   RBC 3.74 (*)    HCT  35.1 (*)    All other components within normal limits   Radiology DG Knee Complete 4 Views Left  Result Date: 09/20/2021 CLINICAL DATA:  Chronic bilateral knee pain. EXAM: LEFT KNEE - COMPLETE 4+ VIEW COMPARISON:  None. FINDINGS: No evidence of fracture, dislocation, or joint effusion. No evidence of arthropathy or other focal bone abnormality. Soft tissues are unremarkable. IMPRESSION: Negative. Electronically Signed   By: Kennith Center M.D.   On: 09/20/2021 05:29   DG Knee Complete 4 Views Right  Result Date: 09/20/2021 CLINICAL DATA:  Bilateral knee pain.  Chronic. EXAM: RIGHT KNEE - COMPLETE 4+ VIEW COMPARISON:  None. FINDINGS: No evidence of fracture, dislocation, or joint effusion. No  evidence of arthropathy or other focal bone abnormality. Soft tissues are unremarkable. IMPRESSION: Negative. Electronically Signed   By: Kennith Center M.D.   On: 09/20/2021 05:26    Procedures Procedures    Medications Ordered in ED Medications  naproxen (NAPROSYN) tablet 500 mg (500 mg Oral Given 09/19/21 2244)    ED Course/ Medical Decision Making/ A&P                           Medical Decision Making Amount and/or Complexity of Data Reviewed Labs: ordered. Radiology: ordered.  Risk Prescription drug management.   Bilateral knee pain which seems most likely to be osteoarthritis based on history.  Exam is benign.  Will send for x-rays.  I suspect that she would do better taking NSAIDs rather than BC powder, will check metabolic panel to make sure that she does not have renal insufficiency.  Old records are reviewed confirming prior history of knee surgery.  X-rays actually do not show much in the way of degenerative changes.  Her symptoms are certainly significantly worse than what her x-rays look like.  I have independently viewed the images, and agree with the radiologist's interpretation.  Labs show normal renal function, she should be able to tolerate NSAIDs.  Incidental finding of hypokalemia, she is given a dose of oral potassium.  She is discharged with prescriptions for naproxen and K-Dur, referred to orthopedics for follow-up.        Final Clinical Impression(s) / ED Diagnoses Final diagnoses:  Chronic pain of both knees  Hypokalemia  Elevated blood pressure reading with diagnosis of hypertension    Rx / DC Orders ED Discharge Orders          Ordered    potassium chloride SA (KLOR-CON M) 20 MEQ tablet  2 times daily        09/20/21 0649    naproxen (NAPROSYN) 500 MG tablet  2 times daily        09/20/21 0649              Dione Booze, MD 09/20/21 [id number]

## 2021-09-23 ENCOUNTER — Telehealth: Payer: Self-pay

## 2021-09-23 NOTE — Telephone Encounter (Signed)
Called pt left a vm about getting her a sooner appt. Advised her to call the office.

## 2021-10-15 ENCOUNTER — Other Ambulatory Visit: Payer: Self-pay

## 2021-10-15 ENCOUNTER — Ambulatory Visit: Payer: 59 | Attending: Physician Assistant | Admitting: Physician Assistant

## 2021-10-15 ENCOUNTER — Encounter: Payer: Self-pay | Admitting: Physician Assistant

## 2021-10-15 VITALS — BP 170/90 | HR 94 | Wt 129.8 lb

## 2021-10-15 DIAGNOSIS — Z09 Encounter for follow-up examination after completed treatment for conditions other than malignant neoplasm: Secondary | ICD-10-CM | POA: Diagnosis not present

## 2021-10-15 DIAGNOSIS — E876 Hypokalemia: Secondary | ICD-10-CM

## 2021-10-15 DIAGNOSIS — I10 Essential (primary) hypertension: Secondary | ICD-10-CM | POA: Diagnosis not present

## 2021-10-15 DIAGNOSIS — M25562 Pain in left knee: Secondary | ICD-10-CM

## 2021-10-15 DIAGNOSIS — M25561 Pain in right knee: Secondary | ICD-10-CM

## 2021-10-15 MED ORDER — AMLODIPINE BESYLATE 10 MG PO TABS
10.0000 mg | ORAL_TABLET | Freq: Every day | ORAL | 1 refills | Status: DC
  Filled 2021-10-15: qty 30, 30d supply, fill #0
  Filled 2021-11-11: qty 30, 30d supply, fill #1
  Filled 2021-12-15: qty 30, 30d supply, fill #2
  Filled 2022-01-08: qty 30, 30d supply, fill #3
  Filled 2022-02-10: qty 30, 30d supply, fill #4

## 2021-10-15 NOTE — Progress Notes (Signed)
Patient ID: Kerry [name], female   DOB: 1966-07-18, 56 y.o.   MRN: [id number] ? ? ? ?Kerry [name], is a 56 y.o. female ? ?RWE:[id number] ? ?[id number] ? ?DOB - 1965/09/04 ? ?Chief Complaint  ?Patient presents with  ? Hospitalization Follow-up  ?    ? ?Subjective:  ? ?Kerry [name] is a 56 y.o. female here today for a follow up visit After ED visit 09/19/2021 for B knee pain.  She has seen ortho and knee pain is being followed by them.  She has not been taking any BP meds.  Potassium was low at ED and she was given oral supplements ? ?From ED A/P: ?Bilateral knee pain which seems most likely to be osteoarthritis based on history.  Exam is benign.  Will send for x-rays.  I suspect that she would do better taking NSAIDs rather than BC powder, will check metabolic panel to make sure that she does not have renal insufficiency.  Old records are reviewed confirming prior history of knee surgery. ?  ?X-rays actually do not show much in the way of degenerative changes.  Her symptoms are certainly significantly worse than what her x-rays look like.  I have independently viewed the images, and agree with the radiologist's interpretation.  Labs show normal renal function, she should be able to tolerate NSAIDs.  Incidental finding of hypokalemia, she is given a dose of oral potassium.  She is discharged with prescriptions for naproxen and K-Dur, referred to orthopedics for follow-up. ? ? ? ?. Patient has No headache, No chest pain, No abdominal pain - No Nausea, No new weakness tingling or numbness, No Cough - SOB. ? ?No problems updated. ? ?ALLERGIES: ?Allergies  ?Allergen Reactions  ? Diphenhydramine Other (See Comments)  ?  Topical burned skin  ? Amoxicillin Rash  ? ? ?PAST MEDICAL HISTORY: ?Past Medical History:  ?Diagnosis Date  ? Alcohol use   ? Drinks on weekends per her report  ? Hypertension   ? Left bundle branch block (LBBB) on electrocardiogram   ? ? ?MEDICATIONS AT HOME: ?Prior to Admission medications    ?Medication Sig Start Date End Date Taking? Authorizing Provider  ?amLODipine (NORVASC) 10 MG tablet Take 1 tablet (10 mg total) by mouth daily. 10/15/21  Yes Anders Simmonds, PA-C  ?naproxen (NAPROSYN) 500 MG tablet Take 1 tablet (500 mg total) by mouth 2 (two) times daily. 09/20/21  Yes Dione Booze, MD  ?potassium chloride SA (KLOR-CON M) 20 MEQ tablet Take 1 tablet (20 mEq total) by mouth 2 (two) times daily. 09/20/21  Yes Dione Booze, MD  ?diclofenac Sodium (VOLTAREN) 1 % GEL Apply 4 g topically 4 (four) times daily. ?Patient not taking: Reported on 10/15/2021 01/29/20   Darr, Gerilyn Pilgrim, PA-C  ?fluticasone (FLONASE) 50 MCG/ACT nasal spray Place 2 sprays into both nostrils daily. ?Patient not taking: Reported on 10/15/2021 09/22/17   Claiborne Rigg, NP  ?metoprolol succinate (TOPROL-XL) 50 MG 24 hr tablet Take 3 tablets (150 mg total) by mouth daily. 12/06/17 01/05/18  Rollene Rotunda, MD  ? ? ?ROS: ?Neg HEENT ?Neg resp ?Neg cardiac ?Neg GI ?Neg GU ?Neg psych ?Neg neuro ? ?Objective:  ? ?Vitals:  ? 10/15/21 1537  ?BP: (!) 170/90  ?Pulse: 94  ?SpO2: 99%  ?Weight: 129 lb 12.8 oz (58.9 kg)  ? ?Exam ?General appearance : Awake, alert, not in any distress. Speech Clear. Not toxic looking ?HEENT: Atraumatic and Normocephalic, ?Neck: Supple, no JVD. No cervical lymphadenopathy.  ?Chest: Good air entry bilaterally,  CTAB.  No rales/rhonchi/wheezing ?CVS: S1 S2 regular, no murmurs.  ?Extremities: B/L Lower Ext shows no edema, both legs are warm to touch ?Neurology: Awake alert, and oriented X 3, CN II-XII intact, Non focal ?Skin: No Rash ? ?Data Review ?Lab Results  ?Component Value Date  ? HGBA1C  11/22/2008  ?  5.5 ?(NOTE) The ADA recommends the following therapeutic goal for glycemic control related to Hgb A1c measurement: Goal of therapy: <6.5 Hgb A1c  Reference: American Diabetes Association: Clinical Practice Recommendations 2010, Diabetes Care, 2010, 33: (Suppl ? 1).  ? ? ?Assessment & Plan  ? ?1. Essential  hypertension ?Uncontrolled-not on any meds-will resume amlodipine (she is willing to do that) ?- amLODipine (NORVASC) 10 MG tablet; Take 1 tablet (10 mg total) by mouth daily.  Dispense: 90 tablet; Refill: 1 ? ?2. Pain in both knees, unspecified chronicity ?Followed by ortho ? ?3. Hypokalemia ?Was given po replacement last month ?- Basic metabolic panel ? ?4. Encounter for examination following treatment at hospital ? ? ? ?Patient have been counseled extensively about nutrition and exercise. Other issues discussed during this visit include: low cholesterol diet, weight control and daily exercise, foot care, annual eye examinations at Ophthalmology, importance of adherence with medications and regular follow-up. We also discussed long term complications of uncontrolled diabetes and hypertension.  ? ?Return for 3 weeks for BP with Franky Macho and 4 months with PCP. ? ?The patient was given clear instructions to go to ER or return to medical center if symptoms don't improve, worsen or new problems develop. The patient verbalized understanding. The patient was told to call to get lab results if they haven't heard anything in the next week.  ? ? ? ? ?Georgian Co, PA-C ?[facility] ?[place], [place] ?[phone]   ?10/15/2021, 3:51 PM  ?

## 2021-10-15 NOTE — Patient Instructions (Signed)
Check blood pressure 1 to 2 times weekly and record and bring to next visit ?

## 2021-10-16 LAB — BASIC METABOLIC PANEL
BUN/Creatinine Ratio: 25 — ABNORMAL HIGH (ref 9–23)
BUN: 16 mg/dL (ref 6–24)
CO2: 25 mmol/L (ref 20–29)
Calcium: 10.2 mg/dL (ref 8.7–10.2)
Chloride: 100 mmol/L (ref 96–106)
Creatinine, Ser: 0.64 mg/dL (ref 0.57–1.00)
Glucose: 95 mg/dL (ref 70–99)
Potassium: 4.3 mmol/L (ref 3.5–5.2)
Sodium: 139 mmol/L (ref 134–144)
eGFR: 103 mL/min/{1.73_m2} (ref >59)

## 2021-11-11 ENCOUNTER — Other Ambulatory Visit: Payer: Self-pay

## 2021-11-11 ENCOUNTER — Ambulatory Visit: Payer: 59 | Attending: Nurse Practitioner | Admitting: Pharmacist

## 2021-11-11 VITALS — BP 148/83 | HR 98

## 2021-11-11 DIAGNOSIS — I10 Essential (primary) hypertension: Secondary | ICD-10-CM | POA: Diagnosis not present

## 2021-11-11 NOTE — Progress Notes (Signed)
? ?  S:    ?PCP: Meredeth Ide  ?No chief complaint on file. ? ?Kerry [name] is a 56 y.o. female who presents for hypertension evaluation, education, and management. PMH is significant for HTN, aortic valve stenosis, and fatigue. Patient was referred and last seen by Kerry [name], on 10/15/2021. BP was 170/90 at that visit.  ? ?Today, Kerry [name] arrives in good spirits and presents without assistance. Denies dizziness, headache, blurred vision, swelling.  ? ?Patient reports hypertension was diagnosed around 2019. She took herself off of medications and admits that she did not restart these until she was seen last month.   ? ?Family/Social history:  ?Fhx: HTN ?Tobacco: current every day smoker (0.25 PPD) ?Alcohol: occasionally  ? ?Medication adherence reported. Patient has taken BP medications today.  ? ?Current antihypertensives include:  amlodipine 10 mg daily  ? ?Reported home BP readings: none  ? ?Patient reported dietary habits:  ?-Pt admits that she uses salt but tries to limit this  ?-Denies excessive caffeine intake ? ?Patient-reported exercise habits:  ?-Very active at her job  ?-Walks from the bus stop  ? ?O:  ?Vitals:  ? 11/11/21 1548  ?BP: (!) 148/83  ?Pulse: 98  ? ? ? ?Last 3 Office BP readings: ?BP Readings from Last 3 Encounters:  ?11/11/21 (!) 148/83  ?10/15/21 (!) 170/90  ?09/20/21 (!) 150/84  ? ? ?BMET ?   ?Component Value Date/Time  ? NA 139 10/15/2021 1557  ? K 4.3 10/15/2021 1557  ? CL 100 10/15/2021 1557  ? CO2 25 10/15/2021 1557  ? GLUCOSE 95 10/15/2021 1557  ? GLUCOSE 88 09/20/2021 0502  ? BUN 16 10/15/2021 1557  ? CREATININE 0.64 10/15/2021 1557  ? CALCIUM 10.2 10/15/2021 1557  ? GFRNONAA >60 09/20/2021 0502  ? GFRAA 121 09/10/2017 1209  ? ? ?Renal function: ?CrCl cannot be calculated (Patient's most recent lab result is older than the maximum 21 days allowed.). ? ?Clinical ASCVD: No  ?The ASCVD Risk score (Arnett DK, et al., 2019) failed to calculate for the following reasons: ?  Cannot find a  previous HDL lab ?  Cannot find a previous total cholesterol lab ? ? ?A/P: ?Hypertension longstanding currently above goal but improving on current medications. BP goal < 130/80 mmHg. Medication adherence appears much improved. Given her improvement, we will hold off on changes today and have her return in 1 month for reassessment and modification of therapy at that time.  ?-Continued amlodipine 10 mg daily.  ?-Counseled on lifestyle modifications for blood pressure control including reduced dietary sodium, increased exercise, adequate sleep. ?-Encouraged patient to check BP at home and bring log of readings to next visit. Counseled on proper use of home BP cuff.  ? ?Results reviewed and written information provided. Patient verbalized understanding of treatment plan. Total time in face-to-face counseling 20 minutes.  ? ?F/u clinic visit in 1 month. ? ?Butch Penny, PharmD, BCACP, CPP ?Clinical Pharmacist ?[facility] ?[phone] ? ? ?

## 2021-12-11 ENCOUNTER — Other Ambulatory Visit: Payer: Self-pay

## 2021-12-11 ENCOUNTER — Ambulatory Visit: Payer: 59 | Attending: Nurse Practitioner | Admitting: Pharmacist

## 2021-12-11 VITALS — BP 162/89 | HR 98

## 2021-12-11 DIAGNOSIS — I10 Essential (primary) hypertension: Secondary | ICD-10-CM | POA: Diagnosis not present

## 2021-12-11 MED ORDER — VALSARTAN 80 MG PO TABS
80.0000 mg | ORAL_TABLET | Freq: Every day | ORAL | 1 refills | Status: DC
  Filled 2021-12-11: qty 30, 30d supply, fill #0
  Filled 2022-01-08: qty 30, 30d supply, fill #1
  Filled 2022-02-10: qty 30, 30d supply, fill #2

## 2021-12-11 NOTE — Progress Notes (Signed)
? ?  S:    ?PCP: Raul Del  ?No chief complaint on file. ? ?Kerry [name] is a 56 y.o. female who presents for hypertension evaluation, education, and management. PMH is significant for HTN, aortic valve stenosis, and fatigue. Patient was referred and last seen by Kerry [name], on 10/15/2021. I saw her on 11/11/2021 and made no changes.  ? ?Today, Kerry [name] arrives in good spirits and presents without assistance. Denies dizziness, headache, blurred vision, swelling.  ? ?Patient reports hypertension was diagnosed around 2019.  ? ?Family/Social history:  ?Fhx: HTN ?Tobacco: current every day smoker (0.25 PPD) ?Alcohol: occasionally  ? ?Medication adherence reported. Patient has taken BP medications today.  ? ?Current antihypertensives include:  amlodipine 10 mg daily  ? ?Reported home BP readings: none  ? ?Patient reported dietary habits:  ?-Pt admits that she uses salt but tries to limit this  ?-Denies excessive caffeine intake ? ?Patient-reported exercise habits:  ?-Very active at her job  ?-Walks from the bus stop  ? ?O:  ?Vitals:  ? 12/11/21 1517  ?BP: (!) 162/89  ?Pulse: 98  ? ?Last 3 Office BP readings: ?BP Readings from Last 3 Encounters:  ?12/11/21 (!) 162/89  ?11/11/21 (!) 148/83  ?10/15/21 (!) 170/90  ? ? ?BMET ?   ?Component Value Date/Time  ? NA 139 10/15/2021 1557  ? K 4.3 10/15/2021 1557  ? CL 100 10/15/2021 1557  ? CO2 25 10/15/2021 1557  ? GLUCOSE 95 10/15/2021 1557  ? GLUCOSE 88 09/20/2021 0502  ? BUN 16 10/15/2021 1557  ? CREATININE 0.64 10/15/2021 1557  ? CALCIUM 10.2 10/15/2021 1557  ? GFRNONAA >60 09/20/2021 0502  ? GFRAA 121 09/10/2017 1209  ? ? ?Renal function: ?CrCl cannot be calculated (Patient's most recent lab result is older than the maximum 21 days allowed.). ? ?Clinical ASCVD: No  ?The ASCVD Risk score (Arnett DK, et al., 2019) failed to calculate for the following reasons: ?  Cannot find a previous HDL lab ?  Cannot find a previous total cholesterol lab ? ? ?A/P: ?Hypertension  longstanding currently above goal on current medications. BP goal < 130/80 mmHg. Medication adherence appears appropriate. ?-Continued amlodipine 10 mg daily.  ?-Start valsartan 80 mg daily.  ?-Will plan on BMP at follow-up in 1 month. ?-Counseled on lifestyle modifications for blood pressure control including reduced dietary sodium, increased exercise, adequate sleep. ?-Encouraged patient to check BP at home and bring log of readings to next visit. Counseled on proper use of home BP cuff.  ? ?Results reviewed and written information provided. Patient verbalized understanding of treatment plan. Total time in face-to-face counseling 20 minutes.  ? ?F/u clinic visit in 1 month. ? ?Benard Halsted, PharmD, BCACP, CPP ?Clinical Pharmacist ?[place] ?[phone] ? ? ?

## 2021-12-16 ENCOUNTER — Other Ambulatory Visit: Payer: Self-pay

## 2022-01-08 ENCOUNTER — Other Ambulatory Visit: Payer: Self-pay

## 2022-01-15 ENCOUNTER — Encounter: Payer: Self-pay | Admitting: Pharmacist

## 2022-01-15 ENCOUNTER — Ambulatory Visit: Payer: 59 | Attending: Nurse Practitioner | Admitting: Pharmacist

## 2022-01-15 VITALS — BP 135/84

## 2022-01-15 DIAGNOSIS — I10 Essential (primary) hypertension: Secondary | ICD-10-CM

## 2022-01-15 NOTE — Progress Notes (Signed)
   S:    PCP: Meredeth Ide  No chief complaint on file.  Kerry [name] is a 56 y.o. female who presents for hypertension evaluation, education, and management. PMH is significant for HTN, aortic valve stenosis, and fatigue. Patient was referred and last seen by Georgian Co, on 10/15/2021. I saw her on 11/11/2021 and made no changes.   Today, Kerry [name] arrives in good spirits and presents without assistance. Denies dizziness, headache, blurred vision, swelling.   Patient reports hypertension was diagnosed around 2019.   Family/Social history:  Fhx: HTN Tobacco: current every day smoker (0.25 PPD) Alcohol: occasionally   Medication adherence reported. Patient has taken BP medications today.   Current antihypertensives include:  amlodipine 10 mg daily, valsartan 80 mg daily   Reported home BP readings: none   Patient reported dietary habits:  -Pt admits that she uses salt but tries to limit this  -Denies excessive caffeine intake  Patient-reported exercise habits:  -Very active at her job  -Walks from the bus stop   O:  Vitals:   01/15/22 1356  BP: 135/84   Last 3 Office BP readings: BP Readings from Last 3 Encounters:  01/15/22 135/84  12/11/21 (!) 162/89  11/11/21 (!) 148/83    BMET    Component Value Date/Time   NA 139 10/15/2021 1557   K 4.3 10/15/2021 1557   CL 100 10/15/2021 1557   CO2 25 10/15/2021 1557   GLUCOSE 95 10/15/2021 1557   GLUCOSE 88 09/20/2021 0502   BUN 16 10/15/2021 1557   CREATININE 0.64 10/15/2021 1557   CALCIUM 10.2 10/15/2021 1557   GFRNONAA >60 09/20/2021 0502   GFRAA 121 09/10/2017 1209    Renal function: CrCl cannot be calculated (Patient's most recent lab result is older than the maximum 21 days allowed.).  Clinical ASCVD: No  The ASCVD Risk score (Arnett DK, et al., 2019) failed to calculate for the following reasons:   Cannot find a previous HDL lab   Cannot find a previous total cholesterol lab   A/P: Hypertension  longstanding currently above goal on current medications. BP goal < 130/80 mmHg. Medication adherence appears appropriate. -Continued amlodipine 10 mg daily.  -Continue valsartan 80 mg daily.  -BMP -Counseled on lifestyle modifications for blood pressure control including reduced dietary sodium, increased exercise, adequate sleep. -Encouraged patient to check BP at home and bring log of readings to next visit. Counseled on proper use of home BP cuff.   Results reviewed and written information provided. Patient verbalized understanding of treatment plan. Total time in face-to-face counseling 20 minutes.   F/u clinic visit in 1 month.  Butch Penny, PharmD, [place]y [place], CPP Clinical Pharmacist [facility] [phone]

## 2022-01-16 LAB — BASIC METABOLIC PANEL
BUN/Creatinine Ratio: 19 (ref 9–23)
BUN: 13 mg/dL (ref 6–24)
CO2: 24 mmol/L (ref 20–29)
Calcium: 9.9 mg/dL (ref 8.7–10.2)
Chloride: 100 mmol/L (ref 96–106)
Creatinine, Ser: 0.7 mg/dL (ref 0.57–1.00)
Glucose: 94 mg/dL (ref 70–99)
Potassium: 4.2 mmol/L (ref 3.5–5.2)
Sodium: 140 mmol/L (ref 134–144)
eGFR: 101 mL/min/{1.73_m2} (ref >59)

## 2022-02-10 ENCOUNTER — Other Ambulatory Visit: Payer: Self-pay

## 2022-02-12 ENCOUNTER — Other Ambulatory Visit: Payer: Self-pay

## 2022-02-18 ENCOUNTER — Encounter: Payer: Self-pay | Admitting: Nurse Practitioner

## 2022-02-18 ENCOUNTER — Ambulatory Visit: Payer: 59 | Attending: Nurse Practitioner | Admitting: Nurse Practitioner

## 2022-02-18 ENCOUNTER — Other Ambulatory Visit: Payer: Self-pay

## 2022-02-18 VITALS — BP 128/79 | HR 99 | Temp 99.3°F | Ht 59.0 in | Wt 120.4 lb

## 2022-02-18 DIAGNOSIS — Z1231 Encounter for screening mammogram for malignant neoplasm of breast: Secondary | ICD-10-CM | POA: Diagnosis not present

## 2022-02-18 DIAGNOSIS — D649 Anemia, unspecified: Secondary | ICD-10-CM

## 2022-02-18 DIAGNOSIS — Z114 Encounter for screening for human immunodeficiency virus [HIV]: Secondary | ICD-10-CM

## 2022-02-18 DIAGNOSIS — I10 Essential (primary) hypertension: Secondary | ICD-10-CM | POA: Diagnosis not present

## 2022-02-18 DIAGNOSIS — Z1211 Encounter for screening for malignant neoplasm of colon: Secondary | ICD-10-CM | POA: Diagnosis not present

## 2022-02-18 DIAGNOSIS — E785 Hyperlipidemia, unspecified: Secondary | ICD-10-CM

## 2022-02-18 DIAGNOSIS — Z1159 Encounter for screening for other viral diseases: Secondary | ICD-10-CM

## 2022-02-18 MED ORDER — AMLODIPINE BESYLATE 10 MG PO TABS
10.0000 mg | ORAL_TABLET | Freq: Every day | ORAL | 1 refills | Status: DC
  Filled 2022-02-18 – 2022-03-13 (×2): qty 90, 90d supply, fill #0
  Filled 2022-06-15: qty 90, 90d supply, fill #1

## 2022-02-18 MED ORDER — VALSARTAN 80 MG PO TABS
80.0000 mg | ORAL_TABLET | Freq: Every day | ORAL | 1 refills | Status: DC
  Filled 2022-02-18 – 2022-03-13 (×2): qty 90, 90d supply, fill #0
  Filled 2022-06-15 (×2): qty 90, 90d supply, fill #1

## 2022-02-18 NOTE — Progress Notes (Signed)
Assessment & Plan:  Kerry [name] was seen today for hypertension.  Diagnoses and all orders for this visit:  Essential hypertension -     amLODipine (NORVASC) 10 MG tablet; Take 1 tablet (10 mg total) by mouth daily. -     valsartan (DIOVAN) 80 MG tablet; Take 1 tablet (80 mg total) by mouth daily. Continue all antihypertensives as prescribed.  Reminded to bring in blood pressure log for follow  up appointment.  RECOMMENDATIONS: DASH/Mediterranean Diets are healthier choices for HTN.    Breast cancer screening by mammogram -     MM DIAG BREAST TOMO BILATERAL; Future  Colon cancer screening -     Cologuard  Encounter for screening for HIV -     HIV antibody (with reflex)  Need for hepatitis C screening test -     HCV Ab w Reflex to Quant PCR  Anemia, unspecified type -     CBC with Differential  Dyslipidemia, goal LDL below 100 -     Lipid panel    Patient has been counseled on age-appropriate routine health concerns for screening and prevention. These are reviewed and up-to-date. Referrals have been placed accordingly. Immunizations are up-to-date or declined.    Subjective:   Chief Complaint  Patient presents with   Hypertension   Hypertension Pertinent negatives include no blurred vision, chest pain, headaches, malaise/fatigue, palpitations or shortness of breath.   Kerry [name] 56 y.o. female presents to office today for follow up to HTN  She has a past medical history of Alcohol use, Hypertension, and Left bundle branch block  on electrocardiogram.   Patient has been counseled on age-appropriate routine health concerns for screening and prevention. These are reviewed and up-to-date. Referrals have been placed accordingly. Immunizations are up-to-date or declined.     Mammogram: Overdue.  Referral placed today Colonoscopy: Overdue.  Cologuard ordered today.  HTN Blood pressure is well controlled with valsartan 80 mg daily and amlodipine 10 mg daily.  BP  Readings from Last 3 Encounters:  02/18/22 128/79  01/15/22 135/84  12/11/21 (!) 162/89    Review of Systems  Constitutional:  Negative for fever, malaise/fatigue and weight loss.  HENT: Negative.  Negative for nosebleeds.   Eyes: Negative.  Negative for blurred vision, double vision and photophobia.  Respiratory: Negative.  Negative for cough and shortness of breath.   Cardiovascular: Negative.  Negative for chest pain, palpitations and leg swelling.  Gastrointestinal: Negative.  Negative for heartburn, nausea and vomiting.  Musculoskeletal: Negative.  Negative for myalgias.  Neurological: Negative.  Negative for dizziness, focal weakness, seizures and headaches.  Psychiatric/Behavioral: Negative.  Negative for suicidal ideas.     Past Medical History:  Diagnosis Date   Alcohol use    Drinks on weekends per her report   Hypertension    Left bundle branch block (LBBB) on electrocardiogram     Past Surgical History:  Procedure Laterality Date   KNEE CARTILAGE SURGERY Right    KNEE SURGERY      Family History  Problem Relation Age of Onset   Diabetes Mother    Hypertension Mother    Asthma Sister    Hypertension Sister    Hypertension Brother    Diabetes Brother     Social History Reviewed with no changes to be made today.   Outpatient Medications Prior to Visit  Medication Sig Dispense Refill   amLODipine (NORVASC) 10 MG tablet Take 1 tablet (10 mg total) by mouth daily. 90 tablet 1  valsartan (DIOVAN) 80 MG tablet Take 1 tablet (80 mg total) by mouth daily. 90 tablet 1   fluticasone (FLONASE) 50 MCG/ACT nasal spray Place 2 sprays into both nostrils daily. (Patient not taking: Reported on 10/15/2021) 16 g 0   naproxen (NAPROSYN) 500 MG tablet Take 1 tablet (500 mg total) by mouth 2 (two) times daily. (Patient not taking: Reported on 02/18/2022) 30 tablet 0   potassium chloride SA (KLOR-CON M) 20 MEQ tablet Take 1 tablet (20 mEq total) by mouth 2 (two) times daily.  (Patient not taking: Reported on 02/18/2022) 10 tablet 0   No facility-administered medications prior to visit.    Allergies  Allergen Reactions   Diphenhydramine Other (See Comments)    Topical burned skin   Amoxicillin Rash       Objective:    BP 128/79 (BP Location: Left Arm, Patient Position: Sitting, Cuff Size: Normal)   Pulse 99   Temp 99.3 F (37.4 C) (Oral)   Ht 4\' 11"  (1.499 m)   Wt 120 lb 6.4 oz (54.6 kg)   LMP 07/17/2013   SpO2 99%   BMI 24.32 kg/m  Wt Readings from Last 3 Encounters:  02/18/22 120 lb 6.4 oz (54.6 kg)  10/15/21 129 lb 12.8 oz (58.9 kg)  09/19/21 141 lb 8.6 oz (64.2 kg)    Physical Exam Vitals and nursing note reviewed.  Constitutional:      Appearance: She is well-developed.  HENT:     Head: Normocephalic and atraumatic.  Cardiovascular:     Rate and Rhythm: Normal rate and regular rhythm.     Heart sounds: Normal heart sounds. No murmur heard.    No friction rub. No gallop.  Pulmonary:     Effort: Pulmonary effort is normal. No tachypnea or respiratory distress.     Breath sounds: Normal breath sounds. No decreased breath sounds, wheezing, rhonchi or rales.  Chest:     Chest wall: No tenderness.  Abdominal:     General: Bowel sounds are normal.     Palpations: Abdomen is soft.  Musculoskeletal:        General: Normal range of motion.     Cervical back: Normal range of motion.  Skin:    General: Skin is warm and dry.  Neurological:     Mental Status: She is alert and oriented to person, place, and time.     Coordination: Coordination normal.     Comments: Uses straight cane to assist with mobility  Psychiatric:        Behavior: Behavior normal. Behavior is cooperative.        Thought Content: Thought content normal.        Judgment: Judgment normal.          Patient has been counseled extensively about nutrition and exercise as well as the importance of adherence with medications and regular follow-up. The patient was given  clear instructions to go to ER or return to medical center if symptoms don't improve, worsen or new problems develop. The patient verbalized understanding.   Follow-up: Return for PAP SMEAR.   11/17/21, FNP-BC [facility] [place], [place]   02/18/2022, 4:01 PM

## 2022-02-19 LAB — CBC WITH DIFFERENTIAL/PLATELET
Basophils Absolute: 0.1 10*3/uL (ref 0.0–0.2)
Basos: 1 %
EOS (ABSOLUTE): 0 10*3/uL (ref 0.0–0.4)
Eos: 0 %
Hematocrit: 40.2 % (ref 34.0–46.6)
Hemoglobin: 14.4 g/dL (ref 11.1–15.9)
Immature Grans (Abs): 0 10*3/uL (ref 0.0–0.1)
Immature Granulocytes: 0 %
Lymphocytes Absolute: 2.1 10*3/uL (ref 0.7–3.1)
Lymphs: 23 %
MCH: 33.3 pg — ABNORMAL HIGH (ref 26.6–33.0)
MCHC: 35.8 g/dL — ABNORMAL HIGH (ref 31.5–35.7)
MCV: 93 fL (ref 79–97)
Monocytes Absolute: 0.5 10*3/uL (ref 0.1–0.9)
Monocytes: 6 %
Neutrophils Absolute: 6.5 10*3/uL (ref 1.4–7.0)
Neutrophils: 70 %
Platelets: 392 10*3/uL (ref 150–450)
RBC: 4.33 x10E6/uL (ref 3.77–5.28)
RDW: 12.3 % (ref 11.7–15.4)
WBC: 9.2 10*3/uL (ref 3.4–10.8)

## 2022-02-19 LAB — LIPID PANEL
Chol/HDL Ratio: 2 ratio (ref 0.0–4.4)
Cholesterol, Total: 217 mg/dL — ABNORMAL HIGH (ref 100–199)
HDL: 110 mg/dL (ref >39)
LDL Chol Calc (NIH): 95 mg/dL (ref 0–99)
Triglycerides: 70 mg/dL (ref 0–149)
VLDL Cholesterol Cal: 12 mg/dL (ref 5–40)

## 2022-02-19 LAB — HCV AB W REFLEX TO QUANT PCR: HCV Ab: NONREACTIVE

## 2022-02-19 LAB — HIV ANTIBODY (ROUTINE TESTING W REFLEX): HIV Screen 4th Generation wRfx: NONREACTIVE

## 2022-02-19 LAB — HCV INTERPRETATION

## 2022-02-23 ENCOUNTER — Ambulatory Visit: Payer: 59 | Attending: Nurse Practitioner | Admitting: Pharmacist

## 2022-02-23 VITALS — BP 127/78

## 2022-02-23 DIAGNOSIS — I10 Essential (primary) hypertension: Secondary | ICD-10-CM

## 2022-02-23 NOTE — Progress Notes (Signed)
   S:    Kerry [name] is a 56 y.o. female who presents for hypertension evaluation, education, and management.  PMH is significant for HTN. Patient was referred and last seen by Primary Care Provider, Bertram Denver, NP, on 02/18/22. At last visit, BP was 128/79 and no medication changes were made. Counseled on DASH diet and encouraged to keep home BP log.    Today, patient arrives in good spirits and presents without assistance. Denies dizziness, headache, blurred vision, swelling. She reports hypertension was diagnosed in 2019.   Family/Social history: Smokes <1/2 pack of cigarettes daily. HTN and DM in several family members.   Medication adherence is good. Patient has taken BP medications today around 9am.   Current antihypertensives include: amlodipine 10mg  daily, valsartan 80mg  daily   Antihypertensives tried in the past include: HCTZ, metoprolol succinate, clonidine  Reported home BP readings: none  Patient reported dietary habits: not actively working to restrict sodium or follow DASH diet   Patient-reported exercise habits: walking to/from work, up stairs at work  ASCVD risk factors include: HTN, smoking  O:  Vitals:   02/23/22 1445  BP: 127/78    Last 3 Office BP readings: BP Readings from Last 3 Encounters:  02/18/22 128/79  01/15/22 135/84  12/11/21 (!) 162/89    BMET    Component Value Date/Time   NA 140 01/15/2022 1400   K 4.2 01/15/2022 1400   CL 100 01/15/2022 1400   CO2 24 01/15/2022 1400   GLUCOSE 94 01/15/2022 1400   GLUCOSE 88 09/20/2021 0502   BUN 13 01/15/2022 1400   CREATININE 0.70 01/15/2022 1400   CALCIUM 9.9 01/15/2022 1400   GFRNONAA >60 09/20/2021 0502   GFRAA 121 09/10/2017 1209    Renal function: CrCl cannot be calculated (Patient's most recent lab result is older than the maximum 21 days allowed.).  Clinical ASCVD: No  The ASCVD Risk score (Arnett DK, et al., 2019) failed to calculate for the following reasons:   The valid HDL  cholesterol range is 20 to 100 mg/dL  A/P: Hypertension diagnosed 2019 controlled on current medications. BP goal < 130/80 mmHg. Medication adherence appears good.  -Continue amlodipine 10mg  daily, valsartan 80mg  daily  -Counseled on lifestyle modifications for blood pressure control including reduced dietary sodium, increased exercise, reduced smoking. -Encouraged patient to check BP at home and bring log of readings to next visit. Counseled on proper use of home BP cuff.   Results reviewed and written information provided.    Written patient instructions provided. Patient verbalized understanding of treatment plan.  Total time in face to face counseling 20 minutes.    Follow-up:  PCP clinic visit 04/06/22   Patient seen with  2020, PharmD Candidate [employer] 20[employer]  , PharmD, [place], CPP Clinical Pharmacist [facility] [phone]

## 2022-03-02 ENCOUNTER — Other Ambulatory Visit: Payer: Self-pay | Admitting: Nurse Practitioner

## 2022-03-02 DIAGNOSIS — Z1231 Encounter for screening mammogram for malignant neoplasm of breast: Secondary | ICD-10-CM

## 2022-03-13 ENCOUNTER — Other Ambulatory Visit: Payer: Self-pay

## 2022-03-16 ENCOUNTER — Other Ambulatory Visit: Payer: Self-pay

## 2022-04-02 LAB — COLOGUARD

## 2022-04-06 ENCOUNTER — Other Ambulatory Visit (HOSPITAL_COMMUNITY)
Admission: RE | Admit: 2022-04-06 | Discharge: 2022-04-06 | Disposition: A | Discharge status: Home / Self Care | Payer: 59 | Source: Ambulatory Visit | Attending: Nurse Practitioner | Admitting: Nurse Practitioner

## 2022-04-06 ENCOUNTER — Ambulatory Visit: Payer: 59 | Attending: Nurse Practitioner | Admitting: Nurse Practitioner

## 2022-04-06 ENCOUNTER — Other Ambulatory Visit: Payer: Self-pay

## 2022-04-06 ENCOUNTER — Encounter: Payer: Self-pay | Admitting: Nurse Practitioner

## 2022-04-06 VITALS — BP 148/75 | HR 92 | Temp 97.8°F | Ht 59.0 in | Wt 146.0 lb

## 2022-04-06 DIAGNOSIS — M25551 Pain in right hip: Secondary | ICD-10-CM | POA: Diagnosis not present

## 2022-04-06 DIAGNOSIS — Z114 Encounter for screening for human immunodeficiency virus [HIV]: Secondary | ICD-10-CM

## 2022-04-06 DIAGNOSIS — Z124 Encounter for screening for malignant neoplasm of cervix: Secondary | ICD-10-CM | POA: Insufficient documentation

## 2022-04-06 DIAGNOSIS — Z1211 Encounter for screening for malignant neoplasm of colon: Secondary | ICD-10-CM | POA: Diagnosis not present

## 2022-04-06 MED ORDER — MELOXICAM 15 MG PO TABS
7.5000 mg | ORAL_TABLET | Freq: Every day | ORAL | 3 refills | Status: DC
  Filled 2022-04-06: qty 30, 30d supply, fill #0

## 2022-04-06 NOTE — Progress Notes (Signed)
Assessment & Plan:  Kerry [name] was seen today for gynecologic exam.  Diagnoses and all orders for this visit:  Encounter for Papanicolaou smear for cervical cancer screening -     Cytology - PAP -     Cervicovaginal ancillary only  Encounter for screening for HIV -     Cancel: HIV antibody (with reflex)  Colon cancer screening -     Fecal occult blood, imunochemical([facility])  Right hip pain -     meloxicam (MOBIC) 15 MG tablet; Take 0.5-1 tablets (7.5-15 mg total) by mouth daily.    Patient has been counseled on age-appropriate routine health concerns for screening and prevention. These are reviewed and up-to-date. Referrals have been placed accordingly. Immunizations are up-to-date or declined.    Subjective:   Chief Complaint  Patient presents with   Gynecologic Exam   Gynecologic Exam Associated symptoms include joint pain. Pertinent negatives include no abdominal pain, chills, fever, flank pain or rash.   Kerry [name] 56 y.o. female presents to office today for PAP smear. She has complaints of chronic right hip pain and bilateral knee pain. Blood pressure is elevated today and she reports being in a significant amount of pain due to her knee and hip. Currently taking amlodipine 10 mg daily and valsartan 80 mg daily.  BP Readings from Last 3 Encounters:  04/06/22 (!) 148/75  02/23/22 127/78  02/18/22 128/79     Review of Systems  Constitutional: Negative.  Negative for chills, fever, malaise/fatigue and weight loss.  Respiratory: Negative.  Negative for cough, shortness of breath and wheezing.   Cardiovascular: Negative.  Negative for chest pain, orthopnea and leg swelling.  Gastrointestinal:  Negative for abdominal pain.  Genitourinary: Negative.  Negative for flank pain.  Musculoskeletal:  Positive for joint pain.  Skin: Negative.  Negative for rash.  Psychiatric/Behavioral:  Negative for suicidal ideas.     Past Medical History:  Diagnosis Date    Alcohol use    Drinks on weekends per her report   Hypertension    Left bundle branch block (LBBB) on electrocardiogram     Past Surgical History:  Procedure Laterality Date   KNEE CARTILAGE SURGERY Right    KNEE SURGERY      Family History  Problem Relation Age of Onset   Diabetes Mother    Hypertension Mother    Asthma Sister    Hypertension Sister    Hypertension Brother    Diabetes Brother     Social History Reviewed with no changes to be made today.   Outpatient Medications Prior to Visit  Medication Sig Dispense Refill   amLODipine (NORVASC) 10 MG tablet Take 1 tablet (10 mg total) by mouth daily. 90 tablet 1   valsartan (DIOVAN) 80 MG tablet Take 1 tablet (80 mg total) by mouth daily. 90 tablet 1   No facility-administered medications prior to visit.    Allergies  Allergen Reactions   Diphenhydramine Other (See Comments)    Topical burned skin   Amoxicillin Rash       Objective:    BP (!) 148/75   Pulse 92   Temp 97.8 F (36.6 C) (Oral)   Ht 4\' 11"  (1.499 m)   Wt 146 lb (66.2 kg)   LMP 07/17/2013   SpO2 100%   BMI 29.49 kg/m  Wt Readings from Last 3 Encounters:  04/06/22 146 lb (66.2 kg)  02/18/22 120 lb 6.4 oz (54.6 kg)  10/15/21 129 lb 12.8 oz (58.9 kg)  Physical Exam Exam conducted with a chaperone present.  Constitutional:      Appearance: She is well-developed.  HENT:     Head: Normocephalic.  Cardiovascular:     Rate and Rhythm: Normal rate and regular rhythm.     Heart sounds: Normal heart sounds.  Pulmonary:     Effort: Pulmonary effort is normal.     Breath sounds: Normal breath sounds.  Abdominal:     General: Bowel sounds are normal.     Palpations: Abdomen is soft.     Hernia: There is no hernia in the left inguinal area.  Genitourinary:    Exam position: Lithotomy position.     Labia:        Right: No rash, tenderness, lesion or injury.        Left: No rash, tenderness, lesion or injury.      Vagina: Normal. No signs  of injury and foreign body. No vaginal discharge, erythema, tenderness or bleeding.     Cervix: Normal.     Uterus: Not deviated and not enlarged.      Adnexa:        Right: No mass, tenderness or fullness.         Left: No mass, tenderness or fullness.       Rectum: Normal. No external hemorrhoid.  Musculoskeletal:     Right hip: Decreased range of motion.  Lymphadenopathy:     Lower Body: No right inguinal adenopathy. No left inguinal adenopathy.  Skin:    General: Skin is warm and dry.  Neurological:     Mental Status: She is alert and oriented to person, place, and time.  Psychiatric:        Behavior: Behavior normal.        Thought Content: Thought content normal.        Judgment: Judgment normal.          Patient has been counseled extensively about nutrition and exercise as well as the importance of adherence with medications and regular follow-up. The patient was given clear instructions to go to ER or return to medical center if symptoms don't improve, worsen or new problems develop. The patient verbalized understanding.   Follow-up: Return in about 3 months (around 07/07/2022) for HTN.   Claiborne Rigg, FNP-BC [facility] [place], [place] [phone]   04/06/2022, 3:36 PM

## 2022-04-07 LAB — CERVICOVAGINAL ANCILLARY ONLY
Bacterial Vaginitis (gardnerella): POSITIVE — AB
Candida Glabrata: NEGATIVE
Candida Vaginitis: NEGATIVE
Chlamydia: NEGATIVE
Comment: NEGATIVE
Comment: NEGATIVE
Comment: NEGATIVE
Comment: NEGATIVE
Comment: NEGATIVE
Comment: NORMAL
Neisseria Gonorrhea: NEGATIVE
Trichomonas: NEGATIVE

## 2022-04-08 ENCOUNTER — Other Ambulatory Visit: Payer: Self-pay

## 2022-04-12 LAB — CYTOLOGY - PAP
Comment: NEGATIVE
Diagnosis: NEGATIVE
Diagnosis: REACTIVE
High risk HPV: NEGATIVE

## 2022-04-16 ENCOUNTER — Other Ambulatory Visit: Payer: Self-pay | Admitting: Nurse Practitioner

## 2022-04-16 DIAGNOSIS — B9689 Other specified bacterial agents as the cause of diseases classified elsewhere: Secondary | ICD-10-CM

## 2022-04-16 MED ORDER — METRONIDAZOLE 500 MG PO TABS: 500.0000 mg | ORAL_TABLET | Freq: Two times a day (BID) | ORAL | 0 refills | Status: DC

## 2022-04-22 ENCOUNTER — Telehealth: Payer: Self-pay | Admitting: Nurse Practitioner

## 2022-04-22 DIAGNOSIS — N760 Acute vaginitis: Secondary | ICD-10-CM

## 2022-04-22 NOTE — Telephone Encounter (Addendum)
Patient called, left VM to return the call to the office to speak to a nurse. Will need to know why she is needing a refill on antibiotic when it was originally prescribed x 7 days on 04/16/22. After reviewing the recorded call, she did not pick it up at the [facility] on [address] due to transportation, so she wants it sent to [facility] at [place] instead.

## 2022-04-22 NOTE — Telephone Encounter (Signed)
Copied from CRM [id number]. Topic: General - Other >> Apr 22, 2022  3:35 PM Everette C wrote: Reason for CRM: Medication Refill - Medication: metroNIDAZOLE (FLAGYL) 500 MG tablet [[id number]]   Has the patient contacted their pharmacy? Yes (Agent: If no, request that the patient contact the pharmacy for the refill. If patient does not wish to contact the pharmacy document the reason why and proceed with request.) (Agent: If yes, when and what did the pharmacy advise?)  Preferred Pharmacy (with phone number or street name): [facility] [address], [address] [place] [address] Phone: [phone] Fax: [phone] Hours: M-F 7:30a-6:00p   Has the patient been seen for an appointment in the last year OR does the patient have an upcoming appointment? Yes.    Agent: Please be advised that RX refills may take up to 3 business days. We ask that you follow-up with your pharmacy.

## 2022-04-23 ENCOUNTER — Other Ambulatory Visit: Payer: Self-pay

## 2022-04-23 MED ORDER — METRONIDAZOLE 500 MG PO TABS
500.0000 mg | ORAL_TABLET | Freq: Two times a day (BID) | ORAL | 0 refills | Status: AC
  Filled 2022-04-23: qty 14, 7d supply, fill #0

## 2022-04-23 NOTE — Addendum Note (Signed)
Addended by: Lois Huxley, Jeannett Senior L on: 04/23/2022 08:18 AM   Modules accepted: Orders

## 2022-04-23 NOTE — Telephone Encounter (Signed)
Rx sent to our pharmacy.  

## 2022-04-29 ENCOUNTER — Other Ambulatory Visit: Payer: Self-pay

## 2022-06-15 ENCOUNTER — Other Ambulatory Visit: Payer: Self-pay

## 2022-06-16 ENCOUNTER — Other Ambulatory Visit: Payer: Self-pay

## 2022-06-17 ENCOUNTER — Other Ambulatory Visit: Payer: Self-pay

## 2022-07-08 ENCOUNTER — Ambulatory Visit: Payer: 59 | Admitting: Nurse Practitioner

## 2022-08-26 ENCOUNTER — Encounter: Payer: Self-pay | Admitting: Nurse Practitioner

## 2022-08-26 ENCOUNTER — Other Ambulatory Visit: Payer: Self-pay

## 2022-08-26 ENCOUNTER — Ambulatory Visit: Payer: Self-pay | Attending: Nurse Practitioner | Admitting: Nurse Practitioner

## 2022-08-26 VITALS — BP 145/78 | HR 82 | Ht 59.0 in | Wt 127.2 lb

## 2022-08-26 DIAGNOSIS — Z1211 Encounter for screening for malignant neoplasm of colon: Secondary | ICD-10-CM

## 2022-08-26 DIAGNOSIS — I10 Essential (primary) hypertension: Secondary | ICD-10-CM

## 2022-08-26 MED ORDER — VALSARTAN 80 MG PO TABS
80.0000 mg | ORAL_TABLET | Freq: Every day | ORAL | 1 refills | Status: DC
  Filled 2022-08-26: qty 90, 90d supply, fill #0
  Filled 2022-09-14: qty 30, 30d supply, fill #0
  Filled 2022-10-13 (×2): qty 30, 30d supply, fill #1
  Filled 2022-11-11: qty 30, 30d supply, fill #2

## 2022-08-26 MED ORDER — AMLODIPINE BESYLATE 10 MG PO TABS
10.0000 mg | ORAL_TABLET | Freq: Every day | ORAL | 1 refills | Status: DC
  Filled 2022-08-26: qty 90, 90d supply, fill #0
  Filled 2022-09-14: qty 30, 30d supply, fill #0
  Filled 2022-10-13 (×2): qty 30, 30d supply, fill #1
  Filled 2022-11-11: qty 30, 30d supply, fill #2

## 2022-08-26 NOTE — Progress Notes (Signed)
Assessment & Plan:  Kerry [name] was seen today for hypertension.  Diagnoses and all orders for this visit:  Essential hypertension -     amLODipine (NORVASC) 10 MG tablet; Take 1 tablet (10 mg total) by mouth daily. -     valsartan (DIOVAN) 80 MG tablet; Take 1 tablet (80 mg total) by mouth daily. -     CMP14+EGFR Continue all antihypertensives as prescribed.  Reminded to bring in blood pressure log for follow  up appointment.  RECOMMENDATIONS: DASH/Mediterranean Diets are healthier choices for HTN.    Colon cancer screening -     Fecal occult blood, imunochemical    Patient has been counseled on age-appropriate routine health concerns for screening and prevention. These are reviewed and up-to-date. Referrals have been placed accordingly. Immunizations are up-to-date or declined.    Subjective:   Chief Complaint  Patient presents with   Hypertension   Hypertension Pertinent negatives include no blurred vision, chest pain, headaches, malaise/fatigue, palpitations or shortness of breath.   Kerry [name] 57 y.o. female presents to office today for follow up to HTN.   She has a past medical history of Alcohol use, Hypertension, and Left bundle branch block  on electrocardiogram.    Patient has been counseled on age-appropriate routine health concerns for screening and prevention. These are reviewed and up-to-date. Referrals have been placed accordingly. Immunizations are up-to-date or declined.     Mammogram: Overdue.  Referral placed today Colonoscopy: Overdue.  FIT test ordered today  HTN Blood pressure is elevated today. She is taking her valsartan 80mg  and amlodipine 10 mg daily as prescribed. She did smoke a cigarette and drink caffeine prior to her appt today.  BP Readings from Last 3 Encounters:  08/26/22 (!) 145/78  04/06/22 (!) 148/75  02/23/22 127/78     Review of Systems  Constitutional:  Negative for fever, malaise/fatigue and weight loss.  HENT: Negative.   Negative for nosebleeds.   Eyes: Negative.  Negative for blurred vision, double vision and photophobia.  Respiratory: Negative.  Negative for cough and shortness of breath.   Cardiovascular: Negative.  Negative for chest pain, palpitations and leg swelling.  Gastrointestinal: Negative.  Negative for heartburn, nausea and vomiting.  Musculoskeletal: Negative.  Negative for myalgias.  Neurological: Negative.  Negative for dizziness, focal weakness, seizures and headaches.  Psychiatric/Behavioral: Negative.  Negative for suicidal ideas.     Past Medical History:  Diagnosis Date   Alcohol use    Drinks on weekends per her report   Hypertension    Left bundle branch block (LBBB) on electrocardiogram     Past Surgical History:  Procedure Laterality Date   KNEE CARTILAGE SURGERY Right    KNEE SURGERY      Family History  Problem Relation Age of Onset   Diabetes Mother    Hypertension Mother    Asthma Sister    Hypertension Sister    Hypertension Brother    Diabetes Brother     Social History Reviewed with no changes to be made today.   Outpatient Medications Prior to Visit  Medication Sig Dispense Refill   amLODipine (NORVASC) 10 MG tablet Take 1 tablet (10 mg total) by mouth daily. 90 tablet 1   valsartan (DIOVAN) 80 MG tablet Take 1 tablet (80 mg total) by mouth daily. 90 tablet 1   meloxicam (MOBIC) 15 MG tablet Take 0.5-1 tablets (7.5-15 mg total) by mouth daily. 30 tablet 3   No facility-administered medications prior to visit.  Allergies  Allergen Reactions   Diphenhydramine Other (See Comments)    Topical burned skin   Amoxicillin Rash       Objective:    BP (!) 145/78   Pulse 82   Ht 4\' 11"  (1.499 m)   Wt 127 lb 3.2 oz (57.7 kg)   LMP 07/17/2013   SpO2 98%   BMI 25.69 kg/m  Wt Readings from Last 3 Encounters:  08/26/22 127 lb 3.2 oz (57.7 kg)  04/06/22 146 lb (66.2 kg)  02/18/22 120 lb 6.4 oz (54.6 kg)    Physical Exam Vitals and nursing note  reviewed.  Constitutional:      Appearance: She is well-developed.  HENT:     Head: Normocephalic and atraumatic.  Cardiovascular:     Rate and Rhythm: Normal rate and regular rhythm.     Heart sounds: Murmur heard.     No friction rub. No gallop.  Pulmonary:     Effort: Pulmonary effort is normal. No tachypnea or respiratory distress.     Breath sounds: Normal breath sounds. No decreased breath sounds, wheezing, rhonchi or rales.  Chest:     Chest wall: No tenderness.  Abdominal:     General: Bowel sounds are normal.     Palpations: Abdomen is soft.  Musculoskeletal:        General: Normal range of motion.     Cervical back: Normal range of motion.  Skin:    General: Skin is warm and dry.  Neurological:     Mental Status: She is alert and oriented to person, place, and time.     Coordination: Coordination normal.  Psychiatric:        Behavior: Behavior normal. Behavior is cooperative.        Thought Content: Thought content normal.        Judgment: Judgment normal.          Patient has been counseled extensively about nutrition and exercise as well as the importance of adherence with medications and regular follow-up. The patient was given clear instructions to go to ER or return to medical center if symptoms don't improve, worsen or new problems develop. The patient verbalized understanding.   Follow-up: Return for 4 weeks luke BP check. see me in 3 monthns.   Gildardo Pounds, FNP-BC [facility] and [facility] [place], [place]   08/26/2022, 2:17 PM

## 2022-08-27 LAB — CMP14+EGFR
ALT: 10 IU/L (ref 0–32)
AST: 18 IU/L (ref 0–40)
Albumin/Globulin Ratio: 1.8 (ref 1.2–2.2)
Albumin: 4.9 g/dL (ref 3.8–4.9)
Alkaline Phosphatase: 94 IU/L (ref 44–121)
BUN/Creatinine Ratio: 9 (ref 9–23)
BUN: 7 mg/dL (ref 6–24)
Bilirubin Total: 0.2 mg/dL (ref 0.0–1.2)
CO2: 21 mmol/L (ref 20–29)
Calcium: 9.8 mg/dL (ref 8.7–10.2)
Chloride: 100 mmol/L (ref 96–106)
Creatinine, Ser: 0.81 mg/dL (ref 0.57–1.00)
Globulin, Total: 2.8 g/dL (ref 1.5–4.5)
Glucose: 104 mg/dL — ABNORMAL HIGH (ref 70–99)
Potassium: 3.8 mmol/L (ref 3.5–5.2)
Sodium: 139 mmol/L (ref 134–144)
Total Protein: 7.7 g/dL (ref 6.0–8.5)
eGFR: 84 mL/min/{1.73_m2} (ref >59)

## 2022-08-31 ENCOUNTER — Telehealth: Payer: Self-pay

## 2022-08-31 NOTE — Telephone Encounter (Signed)
Telephoned patient at mobile number. Left a message with [employer] (scholarship) contact information. 

## 2022-09-14 ENCOUNTER — Other Ambulatory Visit: Payer: Self-pay

## 2022-09-18 ENCOUNTER — Other Ambulatory Visit: Payer: Self-pay

## 2022-09-24 ENCOUNTER — Ambulatory Visit: Payer: Self-pay | Attending: Nurse Practitioner | Admitting: Pharmacist

## 2022-09-24 VITALS — BP 131/77 | HR 78

## 2022-09-24 DIAGNOSIS — I10 Essential (primary) hypertension: Secondary | ICD-10-CM

## 2022-09-24 NOTE — Progress Notes (Signed)
   S:    PCP: Raul Del  No chief complaint on file.  Kerry [name] is a 57 y.o. female who presents for hypertension evaluation, education, and management. PMH is significant for HTN, aortic valve stenosis, and fatigue. Patient was referred and last seen by Kerry [name], on 08/26/2022. BP was 145/78 at that visit and pt's amlodipine dose was increased. Pharmacy team saw her consistently between 10/2021 - 02/2022.   Today, Kerry [name] arrives in good spirits and presents without assistance. Denies dizziness, headache, blurred vision, swelling. Patient reports hypertension was diagnosed around 2019.   Family/Social history:  Fhx: HTN Tobacco: current every day smoker (0.25 PPD) Alcohol: occasionally   Medication adherence reported. Patient has taken BP medications today.   Current antihypertensives include:  amlodipine 10 mg daily, valsartan 80 mg daily   Reported home BP readings: none   Patient reported dietary habits:  -Pt admits that she uses salt but tries to limit this  -Denies excessive caffeine intake  Patient-reported exercise habits:  -Very active at her job  -Walks from the bus stop   O:  Vitals:   09/24/22 1035  BP: 131/77  Pulse: 78    Last 3 Office BP readings: BP Readings from Last 3 Encounters:  09/24/22 131/77  08/26/22 (!) 145/78  04/06/22 (!) 148/75    BMET    Component Value Date/Time   NA 139 08/26/2022 1419   K 3.8 08/26/2022 1419   CL 100 08/26/2022 1419   CO2 21 08/26/2022 1419   GLUCOSE 104 (H) 08/26/2022 1419   GLUCOSE 88 09/20/2021 0502   BUN 7 08/26/2022 1419   CREATININE 0.81 08/26/2022 1419   CALCIUM 9.8 08/26/2022 1419   GFRNONAA >60 09/20/2021 0502   GFRAA 121 09/10/2017 1209    Renal function: CrCl cannot be calculated (Patient's most recent lab result is older than the maximum 21 days allowed.).  Clinical ASCVD: No  The ASCVD Risk score (Arnett DK, et al., 2019) failed to calculate for the following reasons:   The valid  HDL cholesterol range is 20 to 100 mg/dL   A/P: Hypertension longstanding currently at goal on current medications. BP goal < 130/80 mmHg. Medication adherence appears appropriate. -Continued amlodipine 10 mg daily.  -Continue valsartan 80 mg daily.  -Counseled on lifestyle modifications for blood pressure control including reduced dietary sodium, increased exercise, adequate sleep. -Encouraged patient to check BP at home and bring log of readings to next visit. Counseled on proper use of home BP cuff.   Results reviewed and written information provided. Patient verbalized understanding of treatment plan. Total time in face-to-face counseling 20 minutes.   F/u clinic visit in April with PCP.  Benard Halsted, PharmD, [facility], [place] [phone]

## 2022-10-13 ENCOUNTER — Other Ambulatory Visit (HOSPITAL_BASED_OUTPATIENT_CLINIC_OR_DEPARTMENT_OTHER): Payer: Self-pay

## 2022-10-13 ENCOUNTER — Other Ambulatory Visit: Payer: Self-pay

## 2022-10-15 ENCOUNTER — Other Ambulatory Visit: Payer: Self-pay

## 2022-11-11 ENCOUNTER — Other Ambulatory Visit: Payer: Self-pay

## 2022-11-13 ENCOUNTER — Other Ambulatory Visit: Payer: Self-pay

## 2022-11-25 ENCOUNTER — Other Ambulatory Visit: Payer: Self-pay

## 2022-11-25 ENCOUNTER — Ambulatory Visit: Payer: Medicaid Other | Attending: Nurse Practitioner | Admitting: Nurse Practitioner

## 2022-11-25 ENCOUNTER — Encounter: Payer: Self-pay | Admitting: Nurse Practitioner

## 2022-11-25 DIAGNOSIS — I10 Essential (primary) hypertension: Secondary | ICD-10-CM | POA: Diagnosis not present

## 2022-11-25 MED ORDER — VALSARTAN 80 MG PO TABS
80.0000 mg | ORAL_TABLET | Freq: Every day | ORAL | 1 refills | Status: DC
  Filled 2022-11-25 – 2022-12-10 (×2): qty 90, 90d supply, fill #0
  Filled 2023-03-15: qty 90, 90d supply, fill #1

## 2022-11-25 MED ORDER — AMLODIPINE BESYLATE 10 MG PO TABS
10.0000 mg | ORAL_TABLET | Freq: Every day | ORAL | 1 refills | Status: DC
  Filled 2022-11-25 – 2022-12-10 (×2): qty 90, 90d supply, fill #0
  Filled 2023-03-15 (×2): qty 90, 90d supply, fill #1

## 2022-11-25 NOTE — Progress Notes (Signed)
Assessment & Plan:  Kerry [name] was seen today for hypertension.  Diagnoses and all orders for this visit:  Essential hypertension -     amLODipine (NORVASC) 10 MG tablet; Take 1 tablet (10 mg total) by mouth daily. -     valsartan (DIOVAN) 80 MG tablet; Take 1 tablet (80 mg total) by mouth daily.    Patient has been counseled on age-appropriate routine health concerns for screening and prevention. These are reviewed and up-to-date. Referrals have been placed accordingly. Immunizations are up-to-date or declined.    Subjective:   Chief Complaint  Patient presents with   Hypertension   HPI Kerry [name] 57 y.o. female presents to office today for follow up to HTN   She has a past medical history of Alcohol use, Hypertension, and Left bundle branch block  on electrocardiogram.      Patient has been counseled on age-appropriate routine health concerns for screening and prevention. These are reviewed and up-to-date. Referrals have been placed accordingly. Immunizations are up-to-date or declined.     Mammogram: Overdue.  Referral placed today Colonoscopy: Overdue.  FIT test returned to lab today  PAP SMEAR: UTD    HTN Well controlled. She is taking amlodipine 10mg  and valsartan 80 mg daily as prescribed.  BP Readings from Last 3 Encounters:  11/25/22 131/73  09/24/22 131/77  08/26/22 (!) 145/78     Review of Systems  Constitutional:  Negative for fever, malaise/fatigue and weight loss.  HENT: Negative.  Negative for nosebleeds.   Eyes: Negative.  Negative for blurred vision, double vision and photophobia.  Respiratory: Negative.  Negative for cough and shortness of breath.   Cardiovascular: Negative.  Negative for chest pain, palpitations and leg swelling.  Gastrointestinal: Negative.  Negative for heartburn, nausea and vomiting.  Musculoskeletal: Negative.  Negative for myalgias.  Neurological: Negative.  Negative for dizziness, focal weakness, seizures and headaches.   Psychiatric/Behavioral: Negative.  Negative for suicidal ideas.     Past Medical History:  Diagnosis Date   Alcohol use    Drinks on weekends per her report   Hypertension    Left bundle branch block (LBBB) on electrocardiogram     Past Surgical History:  Procedure Laterality Date   KNEE CARTILAGE SURGERY Right    KNEE SURGERY      Family History  Problem Relation Age of Onset   Diabetes Mother    Hypertension Mother    Asthma Sister    Hypertension Sister    Hypertension Brother    Diabetes Brother     Social History Reviewed with no changes to be made today.   Outpatient Medications Prior to Visit  Medication Sig Dispense Refill   amLODipine (NORVASC) 10 MG tablet Take 1 tablet (10 mg total) by mouth daily. 90 tablet 1   valsartan (DIOVAN) 80 MG tablet Take 1 tablet (80 mg total) by mouth daily. 90 tablet 1   No facility-administered medications prior to visit.    Allergies  Allergen Reactions   Diphenhydramine Other (See Comments)    Topical burned skin   Amoxicillin Rash       Objective:    BP 131/73   Pulse 86   Ht 4\' 11"  (1.499 m)   Wt 128 lb (58.1 kg)   LMP 07/17/2013   SpO2 100%   BMI 25.85 kg/m  Wt Readings from Last 3 Encounters:  11/25/22 128 lb (58.1 kg)  08/26/22 127 lb 3.2 oz (57.7 kg)  04/06/22 146 lb (66.2 kg)  Physical Exam Vitals and nursing note reviewed.  Constitutional:      Appearance: She is well-developed.  HENT:     Head: Normocephalic and atraumatic.  Cardiovascular:     Rate and Rhythm: Normal rate and regular rhythm.     Heart sounds: Murmur heard.     No friction rub. No gallop.  Pulmonary:     Effort: Pulmonary effort is normal. No tachypnea or respiratory distress.     Breath sounds: Normal breath sounds. No decreased breath sounds, wheezing, rhonchi or rales.  Chest:     Chest wall: No tenderness.  Abdominal:     General: Bowel sounds are normal.     Palpations: Abdomen is soft.  Musculoskeletal:         General: Normal range of motion.     Cervical back: Normal range of motion.  Skin:    General: Skin is warm and dry.  Neurological:     Mental Status: She is alert and oriented to person, place, and time.     Coordination: Coordination normal.  Psychiatric:        Behavior: Behavior normal. Behavior is cooperative.        Thought Content: Thought content normal.        Judgment: Judgment normal.          Patient has been counseled extensively about nutrition and exercise as well as the importance of adherence with medications and regular follow-up. The patient was given clear instructions to go to ER or return to medical center if symptoms don't improve, worsen or new problems develop. The patient verbalized understanding.   Follow-up: Return in about 3 months (around 02/24/2023).   Claiborne Rigg, FNP-BC [facility] [place], [place] [phone]   11/25/2022, 12:21 PM

## 2022-11-25 NOTE — Telephone Encounter (Signed)
Telephoned patient at mobile number. Left a voice message with [employer] (scholarship) contact information. 

## 2022-11-28 LAB — FECAL OCCULT BLOOD, IMMUNOCHEMICAL: Fecal Occult Bld: NEGATIVE

## 2022-12-10 ENCOUNTER — Other Ambulatory Visit: Payer: Self-pay

## 2022-12-14 ENCOUNTER — Other Ambulatory Visit: Payer: Self-pay

## 2023-02-26 ENCOUNTER — Ambulatory Visit: Payer: Medicaid Other | Admitting: Nurse Practitioner

## 2023-03-15 ENCOUNTER — Other Ambulatory Visit: Payer: Self-pay

## 2023-03-16 ENCOUNTER — Other Ambulatory Visit: Payer: Self-pay

## 2023-04-23 ENCOUNTER — Ambulatory Visit: Payer: Medicaid Other | Admitting: Nurse Practitioner

## 2023-06-09 ENCOUNTER — Other Ambulatory Visit: Payer: Self-pay | Admitting: Nurse Practitioner

## 2023-06-09 ENCOUNTER — Other Ambulatory Visit: Payer: Self-pay

## 2023-06-09 DIAGNOSIS — I10 Essential (primary) hypertension: Secondary | ICD-10-CM

## 2023-06-09 IMAGING — DX DG KNEE COMPLETE 4+V*R*
4 series · 4 of 4 positions shown · non-contrast
Comparison: None.

CLINICAL DATA: Bilateral knee pain.  Chronic.

EXAM:
RIGHT KNEE - COMPLETE 4+ VIEW

[knee ap]
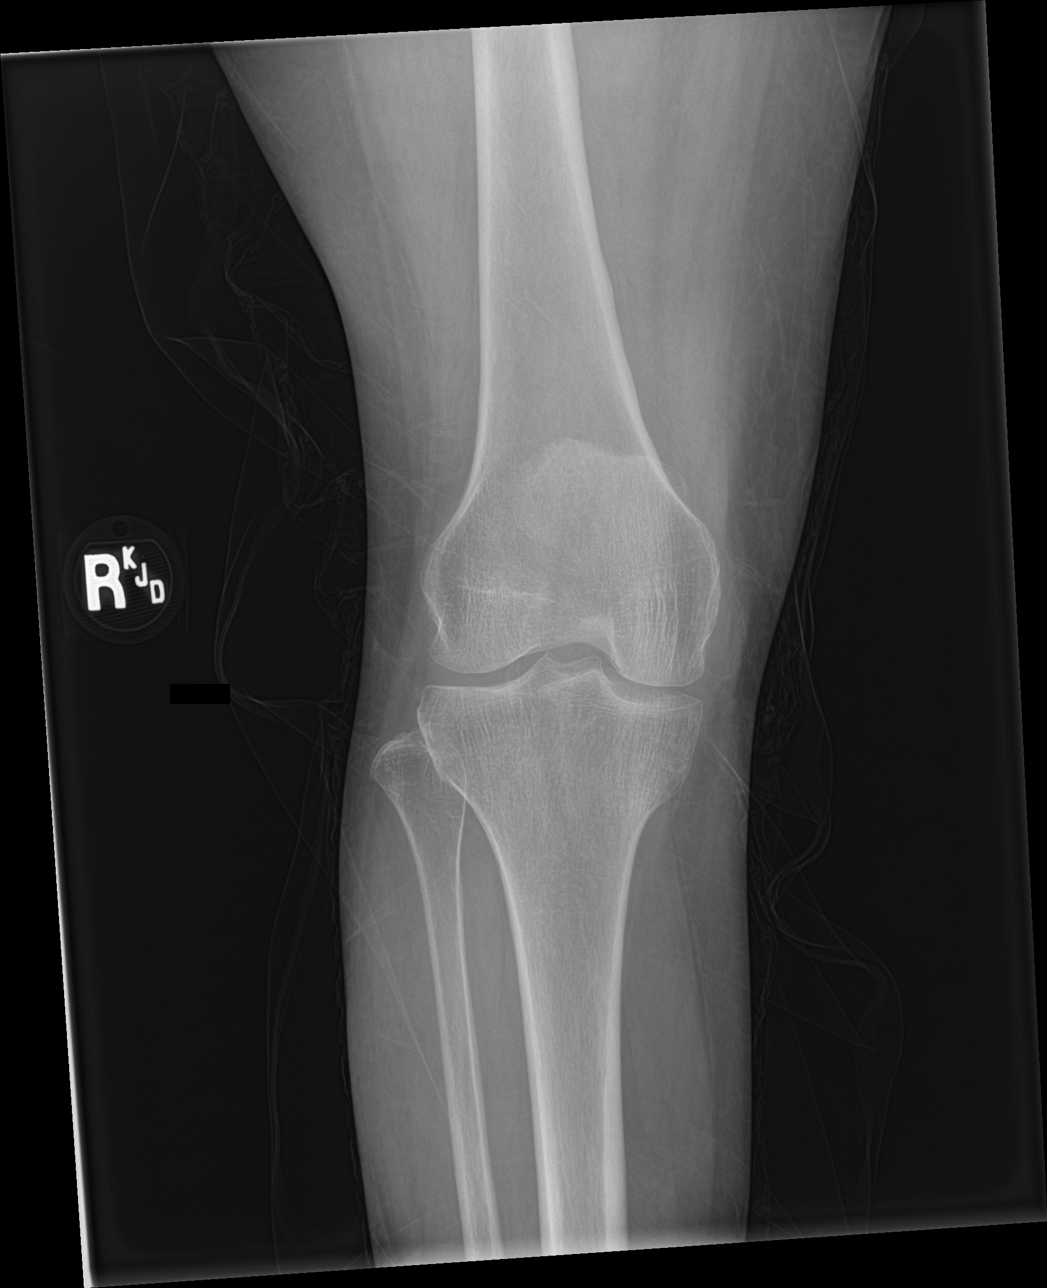

[knee lat]
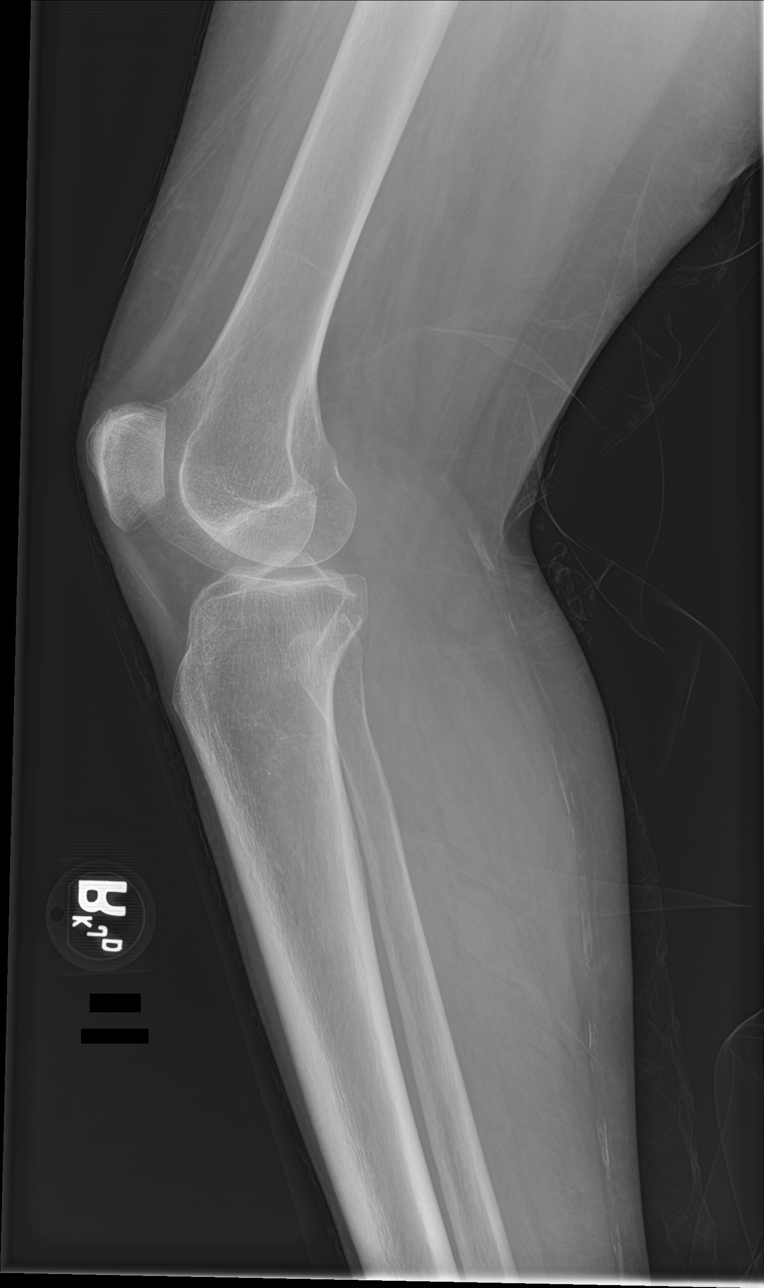

[knee obl (1 of 2)]
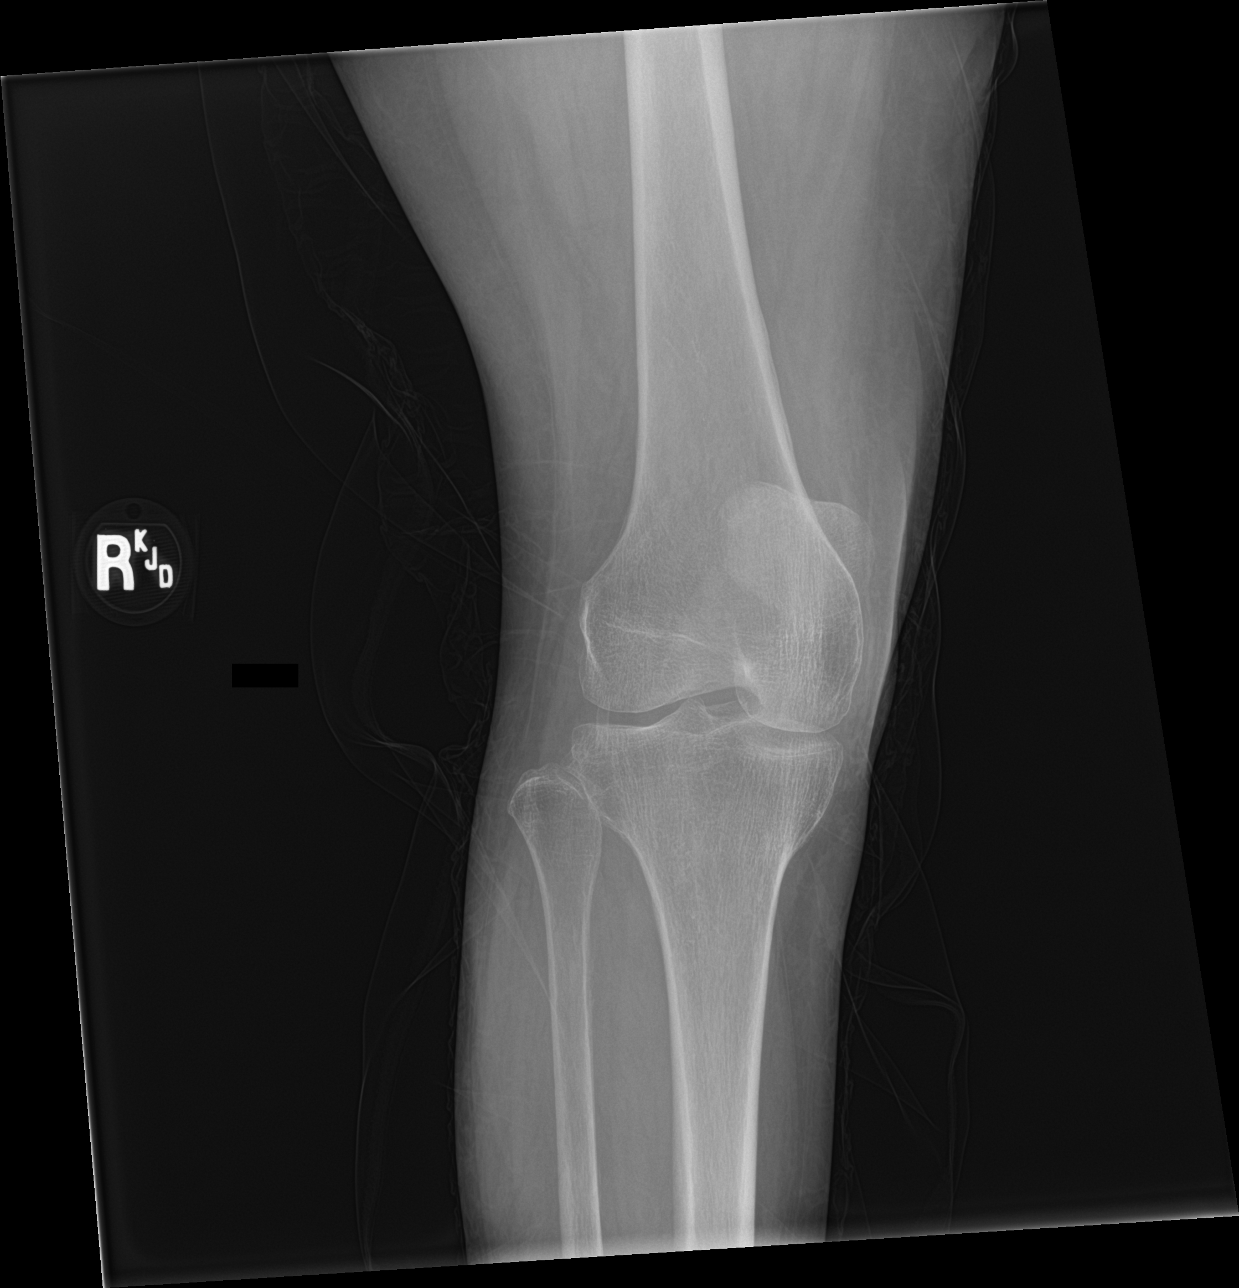

[knee obl (2 of 2)]
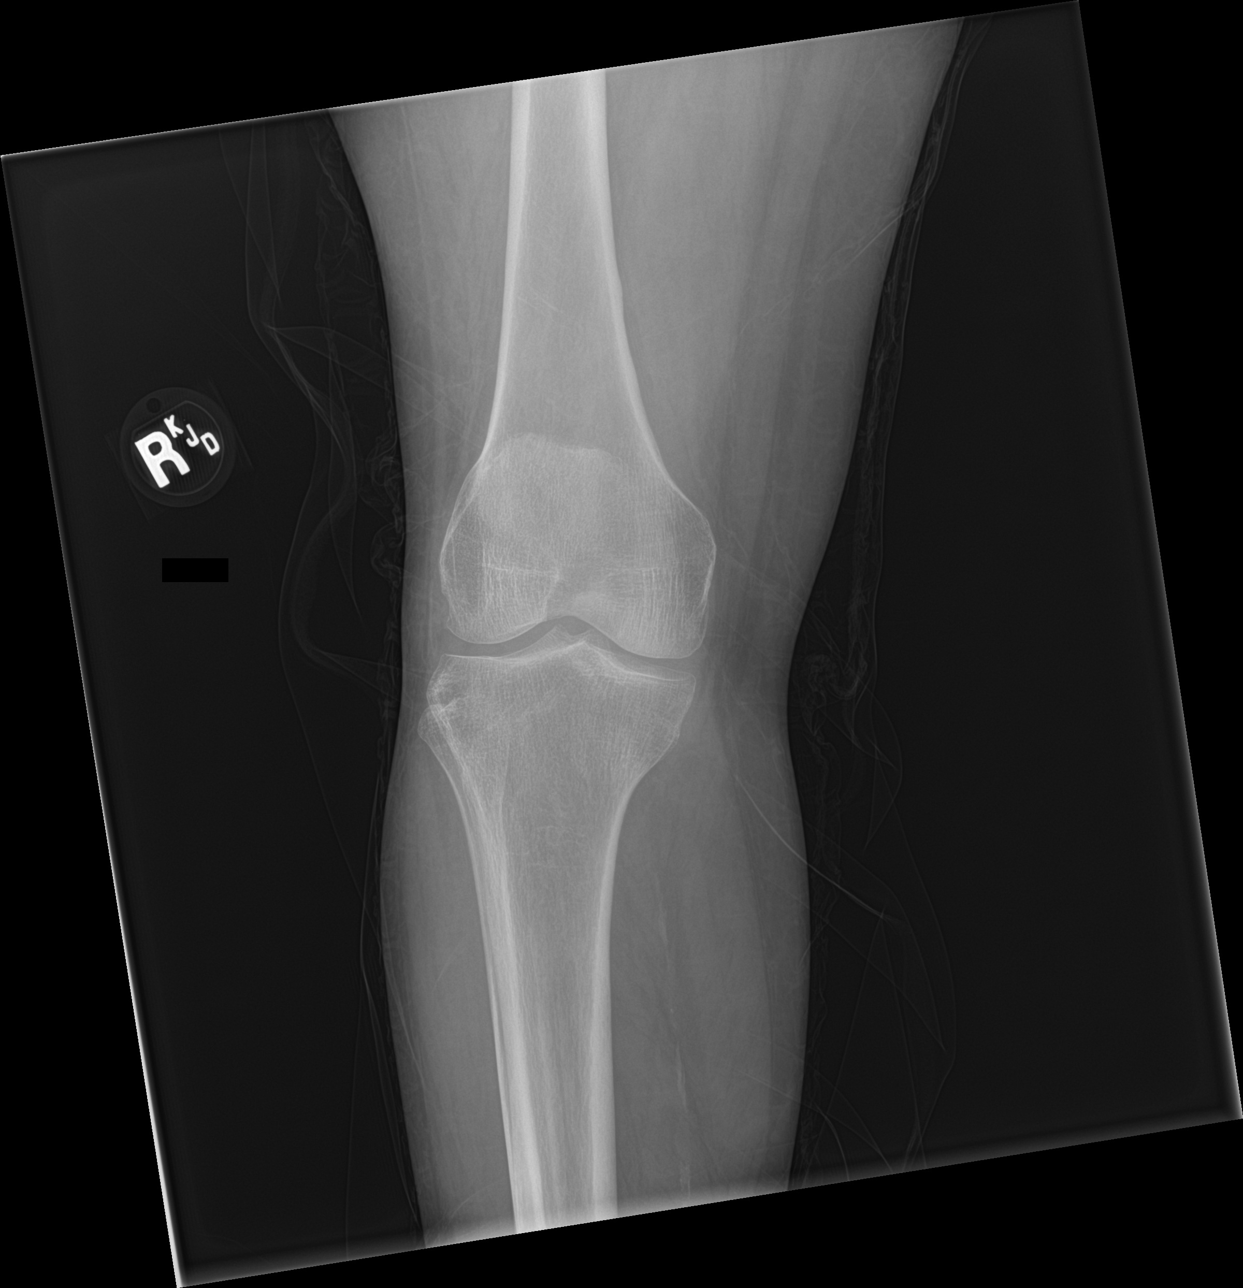

[4 of 4 positions shown; findings below may reference images not displayed]

FINDINGS: No evidence of fracture, dislocation, or joint effusion. No evidence
of arthropathy or other focal bone abnormality. Soft tissues are
unremarkable.
IMPRESSION: Negative.

## 2023-06-10 ENCOUNTER — Telehealth: Payer: Self-pay | Admitting: Nurse Practitioner

## 2023-06-10 ENCOUNTER — Other Ambulatory Visit: Payer: Self-pay | Admitting: Pharmacist

## 2023-06-10 ENCOUNTER — Other Ambulatory Visit: Payer: Self-pay

## 2023-06-10 DIAGNOSIS — I10 Essential (primary) hypertension: Secondary | ICD-10-CM

## 2023-06-10 MED ORDER — AMLODIPINE BESYLATE 10 MG PO TABS
10.0000 mg | ORAL_TABLET | Freq: Every day | ORAL | 0 refills | Status: DC
  Filled 2023-06-10: qty 30, 30d supply, fill #0

## 2023-06-10 MED ORDER — VALSARTAN 80 MG PO TABS
80.0000 mg | ORAL_TABLET | Freq: Every day | ORAL | 0 refills | Status: DC
  Filled 2023-06-10: qty 30, 30d supply, fill #0

## 2023-06-10 NOTE — Telephone Encounter (Signed)
Medication Refill - Medication: valsartan (DIOVAN) 80 MG tablet [[id number]] amLODipine (NORVASC) 10 MG tablet [[id number]]   Has the patient contacted their pharmacy? Yes.     (Agent: If yes, when and what did the pharmacy advise?) Contact Provider   Preferred Pharmacy (with phone number or street name): [facility]   Has the patient been seen for an appointment in the last year OR does the patient have an upcoming appointment? Yes.    Agent: Please be advised that RX refills may take up to 3 business days. We ask that you follow-up with your pharmacy.   Pt says she will run out of medication Tuesday and is requesting a short supply to last her from Tuesday until her appointment.

## 2023-06-10 NOTE — Telephone Encounter (Signed)
Rxns sent.  

## 2023-06-11 ENCOUNTER — Other Ambulatory Visit: Payer: Self-pay

## 2023-06-14 ENCOUNTER — Other Ambulatory Visit: Payer: Self-pay

## 2023-06-30 ENCOUNTER — Other Ambulatory Visit: Payer: Self-pay

## 2023-06-30 ENCOUNTER — Ambulatory Visit: Payer: Medicaid Other | Attending: Physician Assistant | Admitting: Physician Assistant

## 2023-06-30 ENCOUNTER — Encounter: Payer: Self-pay | Admitting: Physician Assistant

## 2023-06-30 VITALS — BP 125/75 | HR 89 | Wt 135.0 lb

## 2023-06-30 DIAGNOSIS — I10 Essential (primary) hypertension: Secondary | ICD-10-CM | POA: Diagnosis not present

## 2023-06-30 DIAGNOSIS — Z833 Family history of diabetes mellitus: Secondary | ICD-10-CM | POA: Diagnosis not present

## 2023-06-30 DIAGNOSIS — Z1231 Encounter for screening mammogram for malignant neoplasm of breast: Secondary | ICD-10-CM | POA: Diagnosis not present

## 2023-06-30 DIAGNOSIS — Z23 Encounter for immunization: Secondary | ICD-10-CM | POA: Diagnosis not present

## 2023-06-30 MED ORDER — AMLODIPINE BESYLATE 10 MG PO TABS
10.0000 mg | ORAL_TABLET | Freq: Every day | ORAL | 1 refills | Status: DC
  Filled 2023-06-30 – 2023-07-12 (×2): qty 90, 90d supply, fill #0
  Filled 2023-10-12: qty 90, 90d supply, fill #1

## 2023-06-30 MED ORDER — VALSARTAN 80 MG PO TABS
80.0000 mg | ORAL_TABLET | Freq: Every day | ORAL | 1 refills | Status: DC
  Filled 2023-06-30 – 2023-07-12 (×2): qty 90, 90d supply, fill #0
  Filled 2023-10-12: qty 90, 90d supply, fill #1

## 2023-06-30 NOTE — Progress Notes (Signed)
Patient ID: Kerry [name], female   DOB: [birth year], 57 y.o.   MRN: [id number]   Kerry [name], is a 57 y.o. female  [id number]  [id number]  DOB - [birth year]  Chief Complaint  Patient presents with   Medication Refill       Subjective:   Kerry [name] is a 57 y.o. female here today for med RF.  Doing well.  No HA/Dizziness/CP/SOB.  Needs RF on BP meds.  Overdue for Mammo.  She would like to get a flu shot.  She ate sausage and eggs a few hours ago.     No problems updated.  ALLERGIES: Allergies  Allergen Reactions   Diphenhydramine Other (See Comments)    Topical burned skin   Amoxicillin Rash    PAST MEDICAL HISTORY: Past Medical History:  Diagnosis Date   Alcohol use    Drinks on weekends per her report   Hypertension    Left bundle branch block (LBBB) on electrocardiogram     MEDICATIONS AT HOME: Prior to Admission medications   Medication Sig Start Date End Date Taking? Authorizing Provider  amLODipine (NORVASC) 10 MG tablet Take 1 tablet (10 mg total) by mouth daily. 06/30/23   Anders Simmonds, PA-C  valsartan (DIOVAN) 80 MG tablet Take 1 tablet (80 mg total) by mouth daily. 06/30/23   Bonnell Placzek, Marzella Schlein, PA-C    ROS: Neg HEENT Neg resp Neg cardiac Neg GI Neg GU Neg MS Neg psych Neg neuro  Objective:   Vitals:   06/30/23 1405  BP: 125/75  Pulse: 89  SpO2: 99%  Weight: 135 lb (61.2 kg)   Exam General appearance : Awake, alert, not in any distress. Speech Clear. Not toxic looking HEENT: Atraumatic and Normocephalic Neck: Supple, no JVD. No cervical lymphadenopathy.  Chest: Good air entry bilaterally, CTAB.  No rales/rhonchi/wheezing CVS: S1 S2 regular, no murmurs.  Extremities: B/L Lower Ext shows no edema, both legs are warm to touch Neurology: Awake alert, and oriented X 3, CN II-XII intact, Non focal Skin: No Rash  Data Review Lab Results  Component Value Date   HGBA1C  11/22/2008    5.5 (NOTE) The ADA recommends the  following therapeutic goal for glycemic control related to Hgb A1c measurement: Goal of therapy: <6.5 Hgb A1c  Reference: American Diabetes Association: Clinical Practice Recommendations 2010, Diabetes Care, 2010, 33: (Suppl  1).    Assessment & Plan   1. Essential hypertension Controlled-continue - Basic Metabolic Panel - Hemoglobin A1c - amLODipine (NORVASC) 10 MG tablet; Take 1 tablet (10 mg total) by mouth daily.  Dispense: 90 tablet; Refill: 1 - valsartan (DIOVAN) 80 MG tablet; Take 1 tablet (80 mg total) by mouth daily.  Dispense: 90 tablet; Refill: 1  2. Needs flu shot Flu shot given  3. Encounter for screening mammogram for malignant neoplasm of breast - MM Digital Screening; Future  4. Family history of diabetes mellitus (DM) Work at a goal of eliminating sugary drinks, candy, desserts, sweets, refined sugars, processed foods, and white carbohydrates.  - Hemoglobin A1c    Return in about 6 months (around 12/28/2023) for PCP for chronic conditions-Kerry [name].  The patient was given clear instructions to go to ER or return to medical center if symptoms don't improve, worsen or new problems develop. The patient verbalized understanding. The patient was told to call to get lab results if they haven't heard anything in the next week.      Georgian Co, PA-C [facility]  [place], [place] [phone]   06/30/2023, 2:34 PM

## 2023-07-01 ENCOUNTER — Telehealth: Payer: Self-pay

## 2023-07-01 LAB — BASIC METABOLIC PANEL
BUN/Creatinine Ratio: 15 (ref 9–23)
BUN: 11 mg/dL (ref 6–24)
CO2: 22 mmol/L (ref 20–29)
Calcium: 10 mg/dL (ref 8.7–10.2)
Chloride: 102 mmol/L (ref 96–106)
Creatinine, Ser: 0.73 mg/dL (ref 0.57–1.00)
Glucose: 99 mg/dL (ref 70–99)
Potassium: 4.9 mmol/L (ref 3.5–5.2)
Sodium: 138 mmol/L (ref 134–144)
eGFR: 95 mL/min/{1.73_m2} (ref >59)

## 2023-07-01 LAB — HEMOGLOBIN A1C
Est. average glucose Bld gHb Est-mCnc: 108 mg/dL
Hgb A1c MFr Bld: 5.4 % (ref 4.8–5.6)

## 2023-07-01 NOTE — Telephone Encounter (Signed)
-----   Message from Georgian Co sent at 07/01/2023  8:24 AM EST ----- Please call patient.  Kidney, liver and electrolytes are normal.  No diabetes.  Thanks, Georgian Co, PA-C

## 2023-07-01 NOTE — Telephone Encounter (Signed)
Pt was called and vm was left, Information has been sent to nurse pool.

## 2023-07-12 ENCOUNTER — Other Ambulatory Visit: Payer: Self-pay

## 2023-07-13 ENCOUNTER — Other Ambulatory Visit: Payer: Self-pay

## 2023-07-30 ENCOUNTER — Ambulatory Visit
Admission: RE | Admit: 2023-07-30 | Discharge: 2023-07-30 | Disposition: A | Discharge status: Home / Self Care | Payer: Medicaid Other | Source: Ambulatory Visit | Attending: Physician Assistant | Admitting: Physician Assistant

## 2023-07-30 DIAGNOSIS — Z1231 Encounter for screening mammogram for malignant neoplasm of breast: Secondary | ICD-10-CM

## 2023-08-05 ENCOUNTER — Telehealth: Payer: Self-pay

## 2023-08-05 NOTE — Telephone Encounter (Signed)
Pt was called and is aware of results, DOB was confirmed.  ?

## 2023-08-05 NOTE — Telephone Encounter (Signed)
-----   Message from Georgian Co sent at 08/04/2023  9:43 AM EST ----- Normal Mammogram.  Thanks, Georgian Co, PA-C

## 2023-10-12 ENCOUNTER — Other Ambulatory Visit: Payer: Self-pay

## 2023-10-13 ENCOUNTER — Other Ambulatory Visit: Payer: Self-pay

## 2023-12-29 ENCOUNTER — Encounter: Payer: Self-pay | Admitting: Nurse Practitioner

## 2023-12-29 ENCOUNTER — Ambulatory Visit: Payer: Medicaid Other | Attending: Nurse Practitioner | Admitting: Nurse Practitioner

## 2023-12-29 ENCOUNTER — Other Ambulatory Visit: Payer: Self-pay

## 2023-12-29 VITALS — BP 92/60 | HR 90 | Resp 19 | Ht 59.0 in | Wt 132.8 lb

## 2023-12-29 DIAGNOSIS — I10 Essential (primary) hypertension: Secondary | ICD-10-CM

## 2023-12-29 DIAGNOSIS — Z1211 Encounter for screening for malignant neoplasm of colon: Secondary | ICD-10-CM

## 2023-12-29 DIAGNOSIS — M25551 Pain in right hip: Secondary | ICD-10-CM

## 2023-12-29 DIAGNOSIS — D649 Anemia, unspecified: Secondary | ICD-10-CM

## 2023-12-29 DIAGNOSIS — G8929 Other chronic pain: Secondary | ICD-10-CM

## 2023-12-29 DIAGNOSIS — M25561 Pain in right knee: Secondary | ICD-10-CM | POA: Diagnosis not present

## 2023-12-29 MED ORDER — MELOXICAM 7.5 MG PO TABS
7.5000 mg | ORAL_TABLET | Freq: Every day | ORAL | 0 refills | Status: DC
  Filled 2023-12-29: qty 30, 30d supply, fill #0

## 2023-12-29 MED ORDER — AMLODIPINE BESYLATE 5 MG PO TABS
5.0000 mg | ORAL_TABLET | Freq: Every day | ORAL | 1 refills | Status: DC
  Filled 2023-12-29: qty 90, 90d supply, fill #0
  Filled 2024-04-13: qty 90, 90d supply, fill #1

## 2023-12-29 MED ORDER — VALSARTAN 80 MG PO TABS
80.0000 mg | ORAL_TABLET | Freq: Every day | ORAL | 1 refills | Status: DC
  Filled 2023-12-29 (×2): qty 90, 90d supply, fill #0
  Filled 2024-04-13: qty 90, 90d supply, fill #1

## 2023-12-29 NOTE — Progress Notes (Signed)
 Assessment & Plan:  Kerry [name] was seen today for hypertension.  Diagnoses and all orders for this visit:  Primary hypertension -     amLODipine  (NORVASC ) 5 MG tablet; Take 1 tablet (5 mg total) by mouth daily. -     valsartan  (DIOVAN ) 80 MG tablet; Take 1 tablet (80 mg total) by mouth daily. -     CMP14+EGFR  Colon cancer screening -     Ambulatory referral to Gastroenterology  Chronic pain of right knee -     Ambulatory referral to Orthopedic Surgery -     meloxicam  (MOBIC ) 7.5 MG tablet; Take 1 tablet (7.5 mg total) by mouth daily.  Chronic right hip pain -     Ambulatory referral to Orthopedic Surgery -     meloxicam  (MOBIC ) 7.5 MG tablet; Take 1 tablet (7.5 mg total) by mouth daily.  Anemia, unspecified type -     CBC with Differential    Patient has been counseled on age-appropriate routine health concerns for screening and prevention. These are reviewed and up-to-date. Referrals have been placed accordingly. Immunizations are up-to-date or declined.    Subjective:   Chief Complaint  Patient presents with   Hypertension    Kerry [name] 58 y.o. female presents to office today for follow-up to hypertension and with complaints of right knee and right hip pain   She has a past medical history of Alcohol use, Hypertension, and Left bundle branch block (LBBB) on electrocardiogram.   HTN Blood pressure is low normal today.  She endorses adherence taking amlodipine  10 mg daily and valsartan  80 mg daily.  She does continue to drink alcohol which I have instructed her that may be contributing to dehydration and low blood pressure today.  BP Readings from Last 3 Encounters:  12/29/23 92/60  06/30/23 125/75  11/25/22 131/73     She has a history of bilateral knee pain with previous steroid injection by orthopedic [place] [facility]).  Today she notes persistent right knee pain that radiates to the right hip.  Associated symptoms include popping and locking up.  Aggravating  factors include prolonged walking, bending and stooping along with cold or damp weather.  She also had meniscal surgery several years ago performed by an orthopedist with [facility].  She denies any recent injury or falls today.   Review of Systems  Constitutional:  Negative for fever, malaise/fatigue and weight loss.  HENT: Negative.  Negative for nosebleeds.   Eyes: Negative.  Negative for blurred vision, double vision and photophobia.  Respiratory: Negative.  Negative for cough and shortness of breath.   Cardiovascular: Negative.  Negative for chest pain, palpitations and leg swelling.  Gastrointestinal: Negative.  Negative for heartburn, nausea and vomiting.  Musculoskeletal:  Positive for joint pain. Negative for myalgias.  Neurological: Negative.  Negative for dizziness, focal weakness, seizures and headaches.  Psychiatric/Behavioral:  Positive for substance abuse. Negative for suicidal ideas.     Past Medical History:  Diagnosis Date   Alcohol use    Drinks on weekends per her report   Hypertension    Left bundle branch block (LBBB) on electrocardiogram     Past Surgical History:  Procedure Laterality Date   KNEE CARTILAGE SURGERY Right    KNEE SURGERY      Family History  Problem Relation Age of Onset   Diabetes Mother    Hypertension Mother    Ovarian cancer Mother 62 - 85   Asthma Sister    Hypertension Sister  Hypertension Brother    Diabetes Brother    BRCA 1/2 Neg Hx    Breast cancer Neg Hx     Social History Reviewed with no changes to be made today.   Outpatient Medications Prior to Visit  Medication Sig Dispense Refill   amLODipine  (NORVASC ) 10 MG tablet Take 1 tablet (10 mg total) by mouth daily. 90 tablet 1   valsartan  (DIOVAN ) 80 MG tablet Take 1 tablet (80 mg total) by mouth daily. 90 tablet 1   No facility-administered medications prior to visit.    Allergies  Allergen Reactions   Diphenhydramine Other (See Comments)    Topical burned  skin   Amoxicillin Rash       Objective:    BP 92/60 (BP Location: Left Arm, Patient Position: Sitting, Cuff Size: Normal)   Pulse 90   Resp 19   Ht 4\' 11"  (1.499 m)   Wt 132 lb 12.8 oz (60.2 kg)   LMP 07/17/2013   SpO2 100%   BMI 26.82 kg/m  Wt Readings from Last 3 Encounters:  12/29/23 132 lb 12.8 oz (60.2 kg)  06/30/23 135 lb (61.2 kg)  11/25/22 128 lb (58.1 kg)    Physical Exam Vitals and nursing note reviewed.  Constitutional:      Appearance: She is well-developed.  HENT:     Head: Normocephalic and atraumatic.  Cardiovascular:     Rate and Rhythm: Normal rate and regular rhythm.     Heart sounds: Normal heart sounds. No murmur heard.    No friction rub. No gallop.  Pulmonary:     Effort: Pulmonary effort is normal. No tachypnea or respiratory distress.     Breath sounds: Normal breath sounds. No decreased breath sounds, wheezing, rhonchi or rales.  Chest:     Chest wall: No tenderness.  Abdominal:     General: Bowel sounds are normal.     Palpations: Abdomen is soft.  Musculoskeletal:        General: Normal range of motion.     Cervical back: Normal range of motion.  Skin:    General: Skin is warm and dry.  Neurological:     Mental Status: She is alert and oriented to person, place, and time.     Coordination: Coordination normal.  Psychiatric:        Behavior: Behavior normal. Behavior is cooperative.        Thought Content: Thought content normal.        Judgment: Judgment normal.          Patient has been counseled extensively about nutrition and exercise as well as the importance of adherence with medications and regular follow-up. The patient was given clear instructions to go to ER or return to medical center if symptoms don't improve, worsen or new problems develop. The patient verbalized understanding.   Follow-up: Return in about 3 months (around 03/30/2024).   Collins Dean, FNP-BC [facility]  [place], [place] [phone]   12/29/2023, 2:24 PM

## 2023-12-30 ENCOUNTER — Ambulatory Visit: Payer: Self-pay | Admitting: Nurse Practitioner

## 2023-12-30 LAB — CMP14+EGFR
ALT: 10 IU/L (ref 0–32)
AST: 20 IU/L (ref 0–40)
Albumin: 4.5 g/dL (ref 3.8–4.9)
Alkaline Phosphatase: 135 IU/L — ABNORMAL HIGH (ref 44–121)
BUN/Creatinine Ratio: 16 (ref 9–23)
BUN: 19 mg/dL (ref 6–24)
Bilirubin Total: 0.3 mg/dL (ref 0.0–1.2)
CO2: 17 mmol/L — ABNORMAL LOW (ref 20–29)
Calcium: 9.5 mg/dL (ref 8.7–10.2)
Chloride: 100 mmol/L (ref 96–106)
Creatinine, Ser: 1.22 mg/dL — ABNORMAL HIGH (ref 0.57–1.00)
Globulin, Total: 3.3 g/dL (ref 1.5–4.5)
Glucose: 95 mg/dL (ref 70–99)
Potassium: 4.4 mmol/L (ref 3.5–5.2)
Sodium: 136 mmol/L (ref 134–144)
Total Protein: 7.8 g/dL (ref 6.0–8.5)
eGFR: 51 mL/min/{1.73_m2} — ABNORMAL LOW (ref >59)

## 2023-12-30 LAB — CBC WITH DIFFERENTIAL/PLATELET
Basophils Absolute: 0 10*3/uL (ref 0.0–0.2)
Basos: 0 %
EOS (ABSOLUTE): 0.1 10*3/uL (ref 0.0–0.4)
Eos: 1 %
Hematocrit: 40.3 % (ref 34.0–46.6)
Hemoglobin: 13.8 g/dL (ref 11.1–15.9)
Immature Grans (Abs): 0 10*3/uL (ref 0.0–0.1)
Immature Granulocytes: 0 %
Lymphocytes Absolute: 3 10*3/uL (ref 0.7–3.1)
Lymphs: 30 %
MCH: 31.9 pg (ref 26.6–33.0)
MCHC: 34.2 g/dL (ref 31.5–35.7)
MCV: 93 fL (ref 79–97)
Monocytes Absolute: 0.6 10*3/uL (ref 0.1–0.9)
Monocytes: 6 %
Neutrophils Absolute: 6.2 10*3/uL (ref 1.4–7.0)
Neutrophils: 63 %
Platelets: 416 10*3/uL (ref 150–450)
RBC: 4.32 x10E6/uL (ref 3.77–5.28)
RDW: 14.1 % (ref 11.7–15.4)
WBC: 10 10*3/uL (ref 3.4–10.8)

## 2024-01-06 ENCOUNTER — Other Ambulatory Visit: Payer: Self-pay

## 2024-01-19 ENCOUNTER — Encounter: Payer: Self-pay | Admitting: Gastroenterology

## 2024-02-09 ENCOUNTER — Other Ambulatory Visit: Payer: Self-pay

## 2024-02-09 ENCOUNTER — Ambulatory Visit (AMBULATORY_SURGERY_CENTER)

## 2024-02-09 VITALS — Ht 59.0 in | Wt 130.0 lb

## 2024-02-09 DIAGNOSIS — Z800 Family history of malignant neoplasm of digestive organs: Secondary | ICD-10-CM

## 2024-02-09 DIAGNOSIS — Z1211 Encounter for screening for malignant neoplasm of colon: Secondary | ICD-10-CM

## 2024-02-09 MED ORDER — PEG 3350-KCL-NA BICARB-NACL 420 G PO SOLR
4000.0000 mL | Freq: Once | ORAL | 0 refills | Status: AC
  Filled 2024-02-09: qty 4000, 1d supply, fill #0

## 2024-02-09 NOTE — Progress Notes (Signed)
 Denies allergies to eggs or soy products. Denies complication of anesthesia or sedation. Denies use of weight loss medication. Denies use of O2.   Emmi instructions given for colonoscopy.

## 2024-02-15 ENCOUNTER — Other Ambulatory Visit: Payer: Self-pay

## 2024-02-28 ENCOUNTER — Telehealth: Payer: Self-pay | Admitting: Nurse Practitioner

## 2024-02-28 NOTE — Telephone Encounter (Signed)
 Contacted pt left vm to call back to resch appt due to provider out of the office

## 2024-02-29 NOTE — Telephone Encounter (Signed)
 2nd attempt:Provider out of the office pt cancel appt she will call back to resch

## 2024-03-01 ENCOUNTER — Telehealth: Payer: Self-pay | Admitting: Nurse Practitioner

## 2024-03-01 ENCOUNTER — Other Ambulatory Visit: Payer: Self-pay

## 2024-03-01 ENCOUNTER — Other Ambulatory Visit: Payer: Self-pay | Admitting: Nurse Practitioner

## 2024-03-01 DIAGNOSIS — G8929 Other chronic pain: Secondary | ICD-10-CM

## 2024-03-01 MED ORDER — MELOXICAM 7.5 MG PO TABS
7.5000 mg | ORAL_TABLET | Freq: Every day | ORAL | 1 refills | Status: AC
  Filled 2024-03-01: qty 30, 30d supply, fill #0

## 2024-03-01 NOTE — Telephone Encounter (Signed)
Meloxicam has been sent

## 2024-03-01 NOTE — Telephone Encounter (Signed)
 Copied from CRM [id number]. Topic: Clinical - Prescription Issue >> Mar 01, 2024  4:20 PM Sasha H wrote:  Reason for CRM: Pt is wanting to know if she can get another prescription of the pain med,that was for her knee and hips, sent in again. Pt does not remember the name

## 2024-03-02 ENCOUNTER — Other Ambulatory Visit: Payer: Self-pay

## 2024-03-02 NOTE — Telephone Encounter (Signed)
 Unable to reach patient by phone.  Voicemail left informing patient of medication being sent to pharmacy on file.

## 2024-03-07 ENCOUNTER — Encounter: Payer: Self-pay | Admitting: Gastroenterology

## 2024-03-07 ENCOUNTER — Ambulatory Visit (AMBULATORY_SURGERY_CENTER): Admitting: Gastroenterology

## 2024-03-07 VITALS — BP 138/80 | HR 79 | Temp 98.2°F | Resp 17 | Ht 59.0 in | Wt 130.0 lb

## 2024-03-07 DIAGNOSIS — K5730 Diverticulosis of large intestine without perforation or abscess without bleeding: Secondary | ICD-10-CM | POA: Diagnosis not present

## 2024-03-07 DIAGNOSIS — Z1211 Encounter for screening for malignant neoplasm of colon: Secondary | ICD-10-CM | POA: Diagnosis not present

## 2024-03-07 DIAGNOSIS — Z800 Family history of malignant neoplasm of digestive organs: Secondary | ICD-10-CM | POA: Diagnosis not present

## 2024-03-07 MED ORDER — SODIUM CHLORIDE 0.9 % IV SOLN: 500.0000 mL | Freq: Once | INTRAVENOUS | Status: DC

## 2024-03-07 NOTE — Op Note (Signed)
 [facility] Patient Name: Kerry [name] Procedure Date: 03/07/2024 10:51 AM MRN: [id number] Endoscopist: Glendia E. Stacia , MD, [id number] Age: 58 Referring MD:  Date of Birth: 1966/03/14 Gender: Female Account #: [id number] Procedure:                Colonoscopy Indications:              Screening for colorectal malignant neoplasm, This                            is the patient's first colonoscopy Medicines:                Monitored Anesthesia Care Procedure:                Pre-Anesthesia Assessment:                           - Prior to the procedure, a History and Physical                            was performed, and patient medications and                            allergies were reviewed. The patient's tolerance of                            previous anesthesia was also reviewed. The risks                            and benefits of the procedure and the sedation                            options and risks were discussed with the patient.                            All questions were answered, and informed consent                            was obtained. Prior Anticoagulants: The patient has                            taken no anticoagulant or antiplatelet agents. ASA                            Grade Assessment: II - A patient with mild systemic                            disease. After reviewing the risks and benefits,                            the patient was deemed in satisfactory condition to                            undergo the procedure.  After obtaining informed consent, the colonoscope                            was passed under direct vision. Throughout the                            procedure, the patient's blood pressure, pulse, and                            oxygen saturations were monitored continuously. The                            CF [id number] #[id number] was introduced through the anus                            and advanced  to the the cecum, identified by                            appendiceal orifice and ileocecal valve. The                            colonoscopy was performed without difficulty. The                            patient tolerated the procedure well. The quality                            of the bowel preparation was adequate. The                            ileocecal valve, appendiceal orifice, and rectum                            were photographed. The bowel preparation used was                            GoLYTELY via split dose instruction. Scope In: 11:00:40 AM Scope Out: 11:13:06 AM Scope Withdrawal Time: 0 hours 9 minutes 16 seconds  Total Procedure Duration: 0 hours 12 minutes 26 seconds  Findings:                 The perianal and digital rectal examinations were                            normal. Pertinent negatives include normal                            sphincter tone and no palpable rectal lesions.                           A few small-mouthed diverticula were found in the                            ascending colon.  The exam was otherwise normal throughout the                            examined colon.                           The retroflexed view of the distal rectum and anal                            verge was normal and showed no anal or rectal                            abnormalities. Complications:            No immediate complications. Estimated Blood Loss:     Estimated blood loss: none. Impression:               - Mild diverticulosis in the ascending colon.                           - The distal rectum and anal verge are normal on                            retroflexion view.                           - No specimens collected. Recommendation:           - Patient has a contact number available for                            emergencies. The signs and symptoms of potential                            delayed complications were discussed with the                             patient. Return to normal activities tomorrow.                            Written discharge instructions were provided to the                            patient.                           - Resume previous diet.                           - Continue present medications.                           - Repeat colonoscopy in 10 years for screening                            purposes. Brenen Beigel E. Stacia, MD 03/07/2024 11:20:27 AM This report has been signed electronically.

## 2024-03-07 NOTE — Progress Notes (Signed)
 Sedate, gd SR, tolerated procedure well, VSS, report to RN

## 2024-03-07 NOTE — Progress Notes (Signed)
 [facility] History and Physical   Primary Care Physician:  Theotis Haze ORN, NP   Reason for Procedure:   Colon cancer screening  Plan:    Screening colonoscopy     HPI: Kerry [name] is a 58 y.o. female undergoing initial average risk screening colonoscopy.  She has no family history of colon cancer and no chronic GI symptoms.    Past Medical History:  Diagnosis Date   Alcohol use    Drinks on weekends per her report   Allergy    Arthritis    Blood transfusion without reported diagnosis    Hypertension    Left bundle branch block (LBBB) on electrocardiogram     Past Surgical History:  Procedure Laterality Date   KNEE CARTILAGE SURGERY Right    KNEE SURGERY      Prior to Admission medications   Medication Sig Start Date End Date Taking? Authorizing Provider  amLODipine  (NORVASC ) 5 MG tablet Take 1 tablet (5 mg total) by mouth daily. 12/29/23  Yes Fleming, Zelda W, NP  valsartan  (DIOVAN ) 80 MG tablet Take 1 tablet (80 mg total) by mouth daily. 12/29/23  Yes Fleming, Zelda W, NP  meloxicam  (MOBIC ) 7.5 MG tablet Take 1 tablet (7.5 mg total) by mouth daily. 03/01/24   Fleming, Zelda W, NP    Current Outpatient Medications  Medication Sig Dispense Refill   amLODipine  (NORVASC ) 5 MG tablet Take 1 tablet (5 mg total) by mouth daily. 90 tablet 1   valsartan  (DIOVAN ) 80 MG tablet Take 1 tablet (80 mg total) by mouth daily. 90 tablet 1   meloxicam  (MOBIC ) 7.5 MG tablet Take 1 tablet (7.5 mg total) by mouth daily. 30 tablet 1   Current Facility-Administered Medications  Medication Dose Route Frequency Provider Last Rate Last Admin   0.9 %  sodium chloride  infusion  500 mL Intravenous Once Stacia Glendia BRAVO, MD        Allergies as of 03/07/2024 - Review Complete 03/07/2024  Allergen Reaction Noted   Amoxicillin Rash 04/14/2015   Diphenhydramine Other (See Comments) 04/14/2015    Family History  Problem Relation Age of Onset   Colon cancer Mother     Diabetes Mother    Hypertension Mother    Ovarian cancer Mother 16 - 45   Asthma Sister    Hypertension Sister    Hypertension Brother    Diabetes Brother    BRCA 1/2 Neg Hx    Breast cancer Neg Hx    Esophageal cancer Neg Hx    Stomach cancer Neg Hx    Rectal cancer Neg Hx     Social History   Socioeconomic History   Marital status: Legally Separated    Spouse name: Not on file   Number of children: Not on file   Years of education: Not on file   Highest education level: Not on file  Occupational History   Not on file  Tobacco Use   Smoking status: Every Day    Current packs/day: 0.50    Average packs/day: 0.5 packs/day for 15.0 years (7.5 ttl pk-yrs)    Types: Cigarettes   Smokeless tobacco: Never  Vaping Use   Vaping status: Never Used  Substance and Sexual Activity   Alcohol use: Yes    Alcohol/week: 6.0 standard drinks of alcohol    Types: 6 Cans of beer per week    Comment: weekly   Drug use: No   Sexual activity: Yes  Other Topics Concern   Not on  file  Social History Narrative   Lives with husband.  She has two children and 5 grands.        Social Drivers of Corporate investment banker Strain: Low Risk  (12/29/2023)   Overall Financial Resource Strain (CARDIA)    Difficulty of Paying Living Expenses: Not very hard  Food Insecurity: No Food Insecurity (12/29/2023)   Hunger Vital Sign    Worried About Running Out of Food in the Last Year: Never true    Ran Out of Food in the Last Year: Never true  Transportation Needs: No Transportation Needs (12/29/2023)   PRAPARE - Administrator, Civil Service (Medical): No    Lack of Transportation (Non-Medical): No  Physical Activity: Inactive (12/29/2023)   Exercise Vital Sign    Days of Exercise per Week: 0 days    Minutes of Exercise per Session: 0 min  Stress: No Stress Concern Present (12/29/2023)   [employer] of Occupational Health - Occupational Stress Questionnaire    Feeling of Stress :  Not at all  Social Connections: Moderately Integrated (12/29/2023)   Social Connection and Isolation Panel    Frequency of Communication with Friends and Family: More than three times a week    Frequency of Social Gatherings with Friends and Family: More than three times a week    Attends Religious Services: 1 to 4 times per year    Active Member of [employer] or Organizations: Yes    Attends Banker Meetings: Never    Marital Status: Separated  Intimate Partner Violence: Not At Risk (12/29/2023)   Humiliation, Afraid, Rape, and Kick questionnaire    Fear of Current or Ex-Partner: No    Emotionally Abused: No    Physically Abused: No    Sexually Abused: No    Review of Systems:  All other review of systems negative except as mentioned in the HPI.  Physical Exam: Vital signs BP (!) 138/96   Pulse 86   Temp 98.2 F (36.8 C) (Temporal)   Ht 4' 11 (1.499 m)   Wt 130 lb (59 kg)   LMP 07/17/2013   SpO2 100%   BMI 26.26 kg/m   General:   Alert,  Well-developed, well-nourished, pleasant and cooperative in NAD Airway:  Mallampati 2 Lungs:  Clear throughout to auscultation.   Heart:  Regular rate and rhythm; no murmurs, clicks, rubs,  or gallops. Abdomen:  Soft, nontender and nondistended. Normal bowel sounds.   Neuro/Psych:  Normal mood and affect. A and O x 3   Meshell Abdulaziz E. Stacia, MD [facility]

## 2024-03-07 NOTE — Progress Notes (Signed)
 Pt's states no medical or surgical changes since previsit or office visit.

## 2024-03-07 NOTE — Patient Instructions (Signed)
-  Handout on diverticulosis provided. -repeat colonoscopy in years for surveillance recommended. -Continue present medications.  YOU HAD AN ENDOSCOPIC PROCEDURE TODAY AT THE [facility]:   Refer to the procedure report that was given to you for any specific questions about what was found during the examination.  If the procedure report does not answer your questions, please call your gastroenterologist to clarify.  If you requested that your care partner not be given the details of your procedure findings, then the procedure report has been included in a sealed envelope for you to review at your convenience later.  YOU SHOULD EXPECT: Some feelings of bloating in the abdomen. Passage of more gas than usual.  Walking can help get rid of the air that was put into your GI tract during the procedure and reduce the bloating. If you had a lower endoscopy (such as a colonoscopy or flexible sigmoidoscopy) you may notice spotting of blood in your stool or on the toilet paper. If you underwent a bowel prep for your procedure, you may not have a normal bowel movement for a few days.  Please Note:  You might notice some irritation and congestion in your nose or some drainage.  This is from the oxygen used during your procedure.  There is no need for concern and it should clear up in a day or so.  SYMPTOMS TO REPORT IMMEDIATELY:  Following lower endoscopy (colonoscopy or flexible sigmoidoscopy):  Excessive amounts of blood in the stool  Significant tenderness or worsening of abdominal pains  Swelling of the abdomen that is new, acute  Fever of 100F or higher   For urgent or emergent issues, a gastroenterologist can be reached at any hour by calling [phone]. Do not use MyChart messaging for urgent concerns.    DIET:  We do recommend a small meal at first, but then you may proceed to your regular diet.  Drink plenty of fluids but you should avoid alcoholic beverages for 24  hours.  ACTIVITY:  You should plan to take it easy for the rest of today and you should NOT DRIVE or use heavy machinery until tomorrow (because of the sedation medicines used during the test).    FOLLOW UP: Our staff will call the number listed on your records the next business day following your procedure.  We will call around 7:15- 8:00 am to check on you and address any questions or concerns that you may have regarding the information given to you following your procedure. If we do not reach you, we will leave a message.     If any biopsies were taken you will be contacted by phone or by letter within the next 1-3 weeks.  Please call us  at [phone] if you have not heard about the biopsies in 3 weeks.    SIGNATURES/CONFIDENTIALITY: You and/or your care partner have signed paperwork which will be entered into your electronic medical record.  These signatures attest to the fact that that the information above on your After Visit Summary has been reviewed and is understood.  Full responsibility of the confidentiality of this discharge information lies with you and/or your care-partner.

## 2024-03-08 ENCOUNTER — Telehealth: Payer: Self-pay

## 2024-03-08 NOTE — Telephone Encounter (Signed)
  Follow up Call-     03/07/2024   10:18 AM  Call back number  Post procedure Call Back phone  # [phone]  Permission to leave phone message Yes     Patient questions:  Do you have a fever, pain , or abdominal swelling? No. Pain Score  0 *  Have you tolerated food without any problems? Yes.    Have you been able to return to your normal activities? Yes.    Do you have any questions about your discharge instructions: Diet   No. Medications  No. Follow up visit  No.  Do you have questions or concerns about your Care? No.  Actions: * If pain score is 4 or above: No action needed, pain <4.

## 2024-03-31 ENCOUNTER — Ambulatory Visit: Admitting: Nurse Practitioner

## 2024-04-13 ENCOUNTER — Other Ambulatory Visit: Payer: Self-pay

## 2024-04-14 ENCOUNTER — Other Ambulatory Visit: Payer: Self-pay

## 2024-07-17 ENCOUNTER — Other Ambulatory Visit: Payer: Self-pay

## 2024-07-17 ENCOUNTER — Other Ambulatory Visit: Payer: Self-pay | Admitting: Nurse Practitioner

## 2024-07-17 DIAGNOSIS — I10 Essential (primary) hypertension: Secondary | ICD-10-CM

## 2024-07-17 MED ORDER — AMLODIPINE BESYLATE 5 MG PO TABS
5.0000 mg | ORAL_TABLET | Freq: Every day | ORAL | 0 refills | Status: DC
  Filled 2024-07-17: qty 30, 30d supply, fill #0

## 2024-07-17 MED ORDER — VALSARTAN 80 MG PO TABS
80.0000 mg | ORAL_TABLET | Freq: Every day | ORAL | 0 refills | Status: DC
  Filled 2024-07-17: qty 30, 30d supply, fill #0

## 2024-07-18 ENCOUNTER — Other Ambulatory Visit: Payer: Self-pay

## 2024-07-18 ENCOUNTER — Telehealth: Payer: Self-pay

## 2024-07-18 NOTE — Telephone Encounter (Signed)
 Copied from CRM [id number]. Topic: Clinical - Prescription Issue >> Jul 18, 2024  3:14 PM Hadassah PARAS wrote: Reason for CRM: Pt is calling to check the status on amLODipine  (NORVASC ) 5 MG tablet and valsartan  (DIOVAN ) 80 MG tablet. When arriving to pharmacy it is stating there is no refills. Please advise pt [phone].

## 2024-07-19 ENCOUNTER — Other Ambulatory Visit: Payer: Self-pay

## 2024-07-19 NOTE — Telephone Encounter (Signed)
 Refills have been sent to the pharmacy 07/17/2024

## 2024-08-18 ENCOUNTER — Other Ambulatory Visit: Payer: Self-pay

## 2024-08-18 ENCOUNTER — Other Ambulatory Visit: Payer: Self-pay | Admitting: Nurse Practitioner

## 2024-08-18 ENCOUNTER — Other Ambulatory Visit: Payer: Self-pay | Admitting: Family Medicine

## 2024-08-18 DIAGNOSIS — I10 Essential (primary) hypertension: Secondary | ICD-10-CM

## 2024-08-18 MED ORDER — AMLODIPINE BESYLATE 5 MG PO TABS
5.0000 mg | ORAL_TABLET | Freq: Every day | ORAL | 0 refills | Status: DC
  Filled 2024-08-18: qty 30, 30d supply, fill #0

## 2024-08-18 MED ORDER — VALSARTAN 80 MG PO TABS
80.0000 mg | ORAL_TABLET | Freq: Every day | ORAL | 0 refills | Status: DC
  Filled 2024-08-18: qty 30, 30d supply, fill #0

## 2024-08-18 NOTE — Telephone Encounter (Signed)
 Copied from CRM [id number]. Topic: Clinical - Medication Refill >> Aug 18, 2024 11:29 AM Aisha D wrote: Medication: valsartan  (DIOVAN ) 80 MG tablet, amLODipine  (NORVASC ) 5 MG tablet  Has the patient contacted their pharmacy? Yes (Agent: If no, request that the patient contact the pharmacy for the refill. If patient does not wish to contact the pharmacy document the reason why and proceed with request.) (Agent: If yes, when and what did the pharmacy advise?)  This is the patient's preferred pharmacy:  [facility] [address] [place] [address] Phone: [phone] Fax: [phone]  Is this the correct pharmacy for this prescription? Yes If no, delete pharmacy and type the correct one.   Has the prescription been filled recently? No  Is the patient out of the medication? Yes  Has the patient been seen for an appointment in the last year OR does the patient have an upcoming appointment? Yes  Can we respond through MyChart? No  Agent: Please be advised that Rx refills may take up to 3 business days. We ask that you follow-up with your pharmacy.

## 2024-08-22 ENCOUNTER — Other Ambulatory Visit: Payer: Self-pay

## 2024-09-19 ENCOUNTER — Other Ambulatory Visit: Payer: Self-pay | Admitting: Nurse Practitioner

## 2024-09-19 DIAGNOSIS — I10 Essential (primary) hypertension: Secondary | ICD-10-CM

## 2024-09-19 NOTE — Telephone Encounter (Unsigned)
 Copied from CRM [id number]. Topic: Clinical - Medication Refill >> Sep 19, 2024  2:19 PM Carla L wrote: Medication: amLODipine  (NORVASC ) 5 MG tablet valsartan  (DIOVAN ) 80 MG tablet  Patient scheduled follow up on 10/10/24. Requesting courtesy refill until able to be seen.   Has the patient contacted their pharmacy? Yes Told to contact office, out of refills.   This is the patient's preferred pharmacy:  [facility] [address] [place] [address] Phone: [phone] Fax: [phone]  Is this the correct pharmacy for this prescription? Yes.   Has the prescription been filled recently? No  Is the patient out of the medication? No  Has the patient been seen for an appointment in the last year OR does the patient have an upcoming appointment? Yes  Can we respond through MyChart? No  Agent: Please be advised that Rx refills may take up to 3 business days. We ask that you follow-up with your pharmacy.

## 2024-09-21 ENCOUNTER — Other Ambulatory Visit: Payer: Self-pay

## 2024-09-21 MED ORDER — VALSARTAN 80 MG PO TABS
80.0000 mg | ORAL_TABLET | Freq: Every day | ORAL | 0 refills | Status: AC
  Filled 2024-09-21: qty 30, 30d supply, fill #0

## 2024-09-21 MED ORDER — AMLODIPINE BESYLATE 5 MG PO TABS
5.0000 mg | ORAL_TABLET | Freq: Every day | ORAL | 0 refills | Status: AC
  Filled 2024-09-21: qty 30, 30d supply, fill #0

## 2024-09-21 NOTE — Telephone Encounter (Signed)
 Requested medication (s) are due for refill today: courtesy refill  Requested medication (s) are on the active medication list: yes   Last refill:  08/18/24 #30 0 refills  Future visit scheduled: yes 10/10/24  Notes to clinic:   can patient get another courtesy refill #20 until next OV?     Requested Prescriptions  Pending Prescriptions Disp Refills   amLODipine  (NORVASC ) 5 MG tablet 30 tablet 0    Sig: Take 1 tablet (5 mg total) by mouth daily. Must have office visit for refills     Cardiovascular: Calcium Channel Blockers 2 Failed - 09/21/2024 10:56 AM      Failed - Valid encounter within last 6 months    Recent Outpatient Visits           8 months ago Primary hypertension   [facility] [place] [facility] [place]. [facility] Theotis Haze ORN, NP   1 year ago Essential hypertension   [facility] [place]. [facility] [place], Jon HERO, [place]   1 year ago Essential hypertension   [facility] [place]. [facility] Theotis Haze ORN, NP   1 year ago Essential hypertension   [facility] [place] - [facility] Dept [facility] [place]. [facility] Fleeta Tonia Garnette LITTIE, RPH-CPP   2 years ago Essential hypertension   [place] [place] - [facility] [place]. [facility] [place], [place] W, NP              Passed - Last BP in normal range    BP Readings from Last 1 Encounters:  03/07/24 138/80         Passed - Last Heart Rate in normal range    Pulse Readings from Last 1 Encounters:  03/07/24 79          valsartan  (DIOVAN ) 80 MG tablet 30 tablet 0    Sig: Take 1 tablet (80 mg total) by mouth daily. Must have office visit for refills     Cardiovascular:  Angiotensin Receptor Blockers Failed - 09/21/2024 10:56 AM      Failed - Cr in normal range and within 180 days    Creatinine, Ser  Date Value Ref Range Status  12/29/2023 1.22 (H) 0.57 - 1.00  mg/dL Final         Failed - K in normal range and within 180 days    Potassium  Date Value Ref Range Status  12/29/2023 4.4 3.5 - 5.2 mmol/L Final         Failed - Valid encounter within last 6 months    Recent Outpatient Visits           8 months ago Primary hypertension   [facility] [place]. [facility] Theotis Haze ORN, NP   1 year ago Essential hypertension   [facility] [place]. [facility] [place], Jon HERO, [place]   1 year ago Essential hypertension   [facility] [place] - [facility] [place]. [facility] Theotis Haze ORN, NP   1 year ago Essential hypertension    [facility] [place] - [facility] Dept Of [place]. [facility] Fleeta Tonia, [place] L, RPH-CPP   2 years ago Essential hypertension   [facility] [place] - [facility] Dept [facility] [place]. [facility] [place], Haze ORN, [place]              Passed - Patient is not pregnant      Passed - Last BP in normal range    BP Readings from Last 1 Encounters:  03/07/24 138/80

## 2024-10-10 ENCOUNTER — Ambulatory Visit: Payer: Self-pay | Admitting: Nurse Practitioner
